# Patient Record
Sex: Female | Born: 1990 | Race: Black or African American | Hispanic: No | Marital: Single | Smoking: Current every day smoker
Health system: Southern US, Community
[De-identification: ages and names within clinical notes are randomized; demographics above are authoritative.]

## PROBLEM LIST (undated history)

## (undated) DIAGNOSIS — O009 Unspecified ectopic pregnancy without intrauterine pregnancy: Secondary | ICD-10-CM

## (undated) DIAGNOSIS — A539 Syphilis, unspecified: Secondary | ICD-10-CM

## (undated) DIAGNOSIS — D649 Anemia, unspecified: Secondary | ICD-10-CM

## (undated) HISTORY — DX: Syphilis, unspecified: A53.9

## (undated) HISTORY — PX: SALPINGECTOMY: SHX328

## (undated) HISTORY — PX: TUBAL LIGATION: SHX77

## (undated) HISTORY — DX: Anemia, unspecified: D64.9

---

## 2011-06-05 DIAGNOSIS — A749 Chlamydial infection, unspecified: Secondary | ICD-10-CM

## 2011-06-05 DIAGNOSIS — A599 Trichomoniasis, unspecified: Secondary | ICD-10-CM

## 2011-06-05 HISTORY — DX: Trichomoniasis, unspecified: A59.9

## 2011-06-05 HISTORY — DX: Chlamydial infection, unspecified: A74.9

## 2021-02-07 ENCOUNTER — Emergency Department (HOSPITAL_COMMUNITY)
Admission: EM | Admit: 2021-02-07 | Discharge: 2021-02-07 | Disposition: A | Discharge status: Home / Self Care | Payer: Medicaid Other | Attending: Emergency Medicine | Admitting: Emergency Medicine

## 2021-02-07 ENCOUNTER — Other Ambulatory Visit: Payer: Self-pay

## 2021-02-07 ENCOUNTER — Encounter (HOSPITAL_COMMUNITY): Payer: Self-pay

## 2021-02-07 ENCOUNTER — Emergency Department (HOSPITAL_COMMUNITY): Payer: Medicaid Other

## 2021-02-07 DIAGNOSIS — R109 Unspecified abdominal pain: Secondary | ICD-10-CM

## 2021-02-07 DIAGNOSIS — F17210 Nicotine dependence, cigarettes, uncomplicated: Secondary | ICD-10-CM | POA: Diagnosis not present

## 2021-02-07 DIAGNOSIS — Z3A01 Less than 8 weeks gestation of pregnancy: Secondary | ICD-10-CM | POA: Insufficient documentation

## 2021-02-07 DIAGNOSIS — R000 Tachycardia, unspecified: Secondary | ICD-10-CM | POA: Insufficient documentation

## 2021-02-07 DIAGNOSIS — O26891 Other specified pregnancy related conditions, first trimester: Secondary | ICD-10-CM | POA: Diagnosis present

## 2021-02-07 HISTORY — DX: Unspecified ectopic pregnancy without intrauterine pregnancy: O00.90

## 2021-02-07 LAB — COMPREHENSIVE METABOLIC PANEL
ALT: 24 U/L (ref 0–44)
AST: 21 U/L (ref 15–41)
Albumin: 4.8 g/dL (ref 3.5–5.0)
Alkaline Phosphatase: 61 U/L (ref 38–126)
Anion gap: 8 (ref 5–15)
BUN: 7 mg/dL (ref 6–20)
CO2: 22 mmol/L (ref 22–32)
Calcium: 9.4 mg/dL (ref 8.9–10.3)
Chloride: 109 mmol/L (ref 98–111)
Creatinine, Ser: 0.6 mg/dL (ref 0.44–1.00)
GFR, Estimated: >60 mL/min (ref >60)
Glucose, Bld: 104 mg/dL — ABNORMAL HIGH (ref 70–99)
Potassium: 3.3 mmol/L — ABNORMAL LOW (ref 3.5–5.1)
Sodium: 139 mmol/L (ref 135–145)
Total Bilirubin: 0.3 mg/dL (ref 0.3–1.2)
Total Protein: 8.4 g/dL — ABNORMAL HIGH (ref 6.5–8.1)

## 2021-02-07 LAB — CBC
HCT: 39.2 % (ref 36.0–46.0)
Hemoglobin: 13.3 g/dL (ref 12.0–15.0)
MCH: 31.3 pg (ref 26.0–34.0)
MCHC: 33.9 g/dL (ref 30.0–36.0)
MCV: 92.2 fL (ref 80.0–100.0)
Platelets: 204 10*3/uL (ref 150–400)
RBC: 4.25 MIL/uL (ref 3.87–5.11)
RDW: 13.2 % (ref 11.5–15.5)
WBC: 12.7 10*3/uL — ABNORMAL HIGH (ref 4.0–10.5)
nRBC: 0 % (ref 0.0–0.2)

## 2021-02-07 LAB — URINALYSIS, ROUTINE W REFLEX MICROSCOPIC
Bilirubin Urine: NEGATIVE
Glucose, UA: NEGATIVE mg/dL
Hgb urine dipstick: NEGATIVE
Ketones, ur: NEGATIVE mg/dL
Leukocytes,Ua: NEGATIVE
Nitrite: NEGATIVE
Protein, ur: NEGATIVE mg/dL
Specific Gravity, Urine: 1.01 (ref 1.005–1.030)
pH: 6.5 (ref 5.0–8.0)

## 2021-02-07 LAB — HCG, QUANTITATIVE, PREGNANCY: hCG, Beta Chain, Quant, S: 50485 m[IU]/mL — ABNORMAL HIGH (ref <5)

## 2021-02-07 LAB — I-STAT BETA HCG BLOOD, ED (MC, WL, AP ONLY): I-stat hCG, quantitative: >2000 m[IU]/mL — ABNORMAL HIGH (ref <5)

## 2021-02-07 LAB — LIPASE, BLOOD: Lipase: 28 U/L (ref 11–51)

## 2021-02-07 NOTE — Discharge Instructions (Addendum)
As discussed, your evaluation today has been largely reassuring.  But, it is important that you monitor your condition carefully, and do not hesitate to return to the ED if you develop new, or concerning changes in your condition.  Otherwise, please follow-up with your physician for appropriate ongoing care.  Below is the interpretation of today's ultrasound: IMPRESSION: 1. Single live intrauterine pregnancy identified as above. 2. Small amount of fluid in the endometrial cavity. Attention on follow up obstetric ultrasounds.

## 2021-02-07 NOTE — ED Triage Notes (Signed)
Patient c/o left mid abdominal pain x 2 days. Patient states she had a positive home pregnancy test . Patient reports a history of ectopic pregnancy.

## 2021-02-07 NOTE — ED Provider Notes (Signed)
[facility]-EMERGENCY DEPT Provider Note   CSN: [id number] Arrival date & time: 02/07/21  0934     History Chief Complaint  Patient presents with   Abdominal Pain    [facility] is a 30 y.o. female.  HPI Patient with a history of ectopic pregnancy, tubal ligation presents with left-sided abdominal pain and home positive pregnancy test. She has no other medical problems.  Last menstrual period was about 6 weeks ago.  5 days ago she developed mild left-sided lower abdominal pain which has progressed and is severe.  No associated vomiting, urinary complaints, fever, but with worsening pain she spoke with her physician and was encouraged to come here for evaluation.    Past Medical History:  Diagnosis Date   Ectopic pregnancy     There are no problems to display for this patient.   Past Surgical History:  Procedure Laterality Date   TUBAL LIGATION       OB History   No obstetric history on file.     History reviewed. No pertinent family history.  Social History   Tobacco Use   Smoking status: Every Day    Packs/day: 0.25    Types: Cigarettes   Smokeless tobacco: Never  Vaping Use   Vaping Use: Never used  Substance Use Topics   Alcohol use: Never    Home Medications Prior to Admission medications   Medication Sig Start Date End Date Taking? Authorizing Provider  Multiple Vitamin (MULTIVITAMIN WITH MINERALS) TABS tablet Take 1 tablet by mouth daily.   Yes [provider]    Allergies    Patient has no known allergies.  Review of Systems   Review of Systems  Constitutional:        Per HPI, otherwise negative  HENT:         Per HPI, otherwise negative  Respiratory:         Per HPI, otherwise negative  Cardiovascular:        Per HPI, otherwise negative  Gastrointestinal:  Positive for abdominal pain. Negative for vomiting.  Endocrine:       Negative aside from HPI  Genitourinary:        Neg aside from HPI    Musculoskeletal:        Per HPI, otherwise negative  Skin: Negative.   Neurological:  Negative for syncope.   Physical Exam Updated Vital Signs BP 120/70 (BP Location: Left Arm)   Pulse 88   Temp 98.2 F (36.8 C) (Oral)   Resp 16   Ht 4\' 9"  (1.448 m)   Wt 49.9 kg   LMP 12/23/2020 (Approximate)   SpO2 100%   BMI 23.80 kg/m   Physical Exam Vitals and nursing note reviewed.  Constitutional:      General: She is not in acute distress.    Appearance: She is well-developed.  HENT:     Head: Normocephalic and atraumatic.  Eyes:     Conjunctiva/sclera: Conjunctivae normal.  Cardiovascular:     Rate and Rhythm: Regular rhythm. Tachycardia present.  Pulmonary:     Effort: Pulmonary effort is normal. No respiratory distress.     Breath sounds: Normal breath sounds. No stridor.  Abdominal:     General: There is no distension.     Tenderness: There is abdominal tenderness.  Skin:    General: Skin is warm and dry.  Neurological:     Mental Status: She is alert and oriented to person, place, and time.  Cranial Nerves: No cranial nerve deficit.    ED Results / Procedures / Treatments   Labs (all labs ordered are listed, but only abnormal results are displayed) Labs Reviewed  COMPREHENSIVE METABOLIC PANEL - Abnormal; Notable for the following components:      Result Value   Potassium 3.3 (*)    Glucose, Bld 104 (*)    Total Protein 8.4 (*)    All other components within normal limits  CBC - Abnormal; Notable for the following components:   WBC 12.7 (*)    All other components within normal limits  HCG, QUANTITATIVE, PREGNANCY - Abnormal; Notable for the following components:   hCG, Beta Chain, Quant, S 50,485 (*)    All other components within normal limits  I-STAT BETA HCG BLOOD, ED ([facility], [facility], [facility] ONLY) - Abnormal; Notable for the following components:   I-stat hCG, quantitative >2,000.0 (*)    All other components within normal limits  LIPASE, BLOOD  URINALYSIS,  ROUTINE W REFLEX MICROSCOPIC    EKG None  Radiology US OB Comp < 14 Wks  Result Date: 02/07/2021 CLINICAL DATA:  Rule out ectopic EXAM: OBSTETRIC <14 WK ULTRASOUND TECHNIQUE: Transabdominal ultrasound was performed for evaluation of the gestation as well as the maternal uterus and adnexal regions. COMPARISON:  None. FINDINGS: Intrauterine gestational sac: Single Yolk sac:  Not Visualized. Embryo:  Visualized. Cardiac Activity: Visualized. Heart Rate: 275 bpm MSD:  17.6 mm   6 w   5 d CRL:   10.0 mm   7 w 1 d Subchorionic hemorrhage:  None visualized. Maternal uterus/adnexae: The ovaries are normal with a probable corpus luteum on the right. There is a small amount of fluid in the endometrial cavity. IMPRESSION: 1. Single live intrauterine pregnancy identified as above. 2. Small amount of fluid in the endometrial cavity. Attention on follow up obstetric ultrasounds. Electronically Signed   By: Lesia Hausen M.D.   On: 02/07/2021 12:10   US OB Transvaginal  Result Date: 02/07/2021 CLINICAL DATA:  Rule out ectopic EXAM: OBSTETRIC <14 WK ULTRASOUND TECHNIQUE: Transabdominal ultrasound was performed for evaluation of the gestation as well as the maternal uterus and adnexal regions. COMPARISON:  None. FINDINGS: Intrauterine gestational sac: Single Yolk sac:  Not Visualized. Embryo:  Visualized. Cardiac Activity: Visualized. Heart Rate: 275 bpm MSD:  17.6 mm   6 w   5 d CRL:   10.0 mm   7 w 1 d Subchorionic hemorrhage:  None visualized. Maternal uterus/adnexae: The ovaries are normal with a probable corpus luteum on the right. There is a small amount of fluid in the endometrial cavity. IMPRESSION: 1. Single live intrauterine pregnancy identified as above. 2. Small amount of fluid in the endometrial cavity. Attention on follow up obstetric ultrasounds. Electronically Signed   By: Lesia Hausen M.D.   On: 02/07/2021 12:10    Procedures Procedures   Medications Ordered in ED Medications - No data to  display  ED Course  I have reviewed the triage vital signs and the nursing notes.  Pertinent labs & imaging results that were available during my care of the patient were reviewed by me and considered in my medical decision making (see chart for details).   12:47 PM Patient in no distress, sitting upright.  Labs reviewed, discussed, ultrasound also reviewed, discussed.  Ultrasound consistent with intrauterine pregnancy, no evidence for ectopic.  Labs consistent with estimated gestational age about 6/7 weeks.  No evidence for distress, patient appropriate for discharge with outpatient follow-up with  our obstetrics clinic. MDM Rules/Calculators/A&P MDM Number of Diagnoses or Management Options Abdominal pain during pregnancy in first trimester: new, needed workup   Amount and/or Complexity of Data Reviewed Clinical lab tests: ordered and reviewed Tests in the radiology section of CPT: ordered and reviewed Tests in the medicine section of CPT: reviewed and ordered Decide to obtain previous medical records or to obtain history from someone other than the patient: yes Review and summarize past medical records: yes Independent visualization of images, tracings, or specimens: yes  Risk of Complications, Morbidity, and/or Mortality Presenting problems: high Diagnostic procedures: high Management options: high  Critical Care Total time providing critical care: < 30 minutes  Patient Progress Patient progress: stable   Final Clinical Impression(s) / ED Diagnoses Final diagnoses:  Abdominal pain during pregnancy in first trimester     Gerhard Munch, MD 02/07/21 1248

## 2021-02-22 ENCOUNTER — Telehealth (INDEPENDENT_AMBULATORY_CARE_PROVIDER_SITE_OTHER): Payer: Medicaid Other

## 2021-02-22 DIAGNOSIS — R112 Nausea with vomiting, unspecified: Secondary | ICD-10-CM

## 2021-02-22 DIAGNOSIS — O09219 Supervision of pregnancy with history of pre-term labor, unspecified trimester: Secondary | ICD-10-CM

## 2021-02-22 DIAGNOSIS — Z5941 Food insecurity: Secondary | ICD-10-CM

## 2021-02-22 DIAGNOSIS — O0990 Supervision of high risk pregnancy, unspecified, unspecified trimester: Secondary | ICD-10-CM | POA: Insufficient documentation

## 2021-02-22 DIAGNOSIS — Z3A00 Weeks of gestation of pregnancy not specified: Secondary | ICD-10-CM

## 2021-02-22 MED ORDER — PRENATAL PLUS 27-1 MG PO TABS: 1.0000 | ORAL_TABLET | Freq: Every day | ORAL | 11 refills | Status: DC

## 2021-02-22 MED ORDER — VITAMIN B-6 25 MG PO TABS: 25.0000 mg | ORAL_TABLET | Freq: Every day | ORAL | 3 refills | Status: DC

## 2021-02-22 MED ORDER — DIPHENHYDRAMINE HCL 50 MG PO TABS
50.0000 mg | ORAL_TABLET | Freq: Every evening | ORAL | 0 refills | Status: DC | PRN
Start: 2021-02-22 — End: 2021-03-07

## 2021-02-22 NOTE — Progress Notes (Signed)
New OB Intake  I connected with  [facility] on 02/22/21 at 11:15 AM EDT by MyChart Video Visit and verified that I am speaking with the correct person using two identifiers. Nurse is located at [facility] [place] and pt is located at home.  I discussed the limitations, risks, security and privacy concerns of performing an evaluation and management service by telephone and the availability of in person appointments. I also discussed with the patient that there may be a patient responsible charge related to this service. The patient expressed understanding and agreed to proceed.  I explained I am completing New OB Intake today. We discussed her EDD of 09/28/21 that is based on LMP of 12/22/20. Pt is G4/P1. I reviewed her allergies, medications, Medical/Surgical/OB history, and appropriate screenings. I informed her of [facility] services. Based on history, this is a/an  pregnancy complicated by preterm labor  .   Patient Active Problem List   Diagnosis Date Noted   Preterm delivery, delivered 02/22/2021   Supervision of high risk pregnancy, antepartum 02/22/2021    Concerns addressed today  Delivery Plans:  [facility] at [facility]   MyChart/Babyscripts MyChart access verified. I explained pt will have some visits in office and some virtually. Babyscripts instructions given and order placed. Patient verifies receipt of registration text/e-mail. Account successfully created and app downloaded.  Blood Pressure Cuff  Blood pressure cuff ordered for patient to pick-up from [facility]. Explained after first prenatal appt pt will check weekly and document in Babyscripts.  Weight scale: Patient    have weight scale. Weight scale ordered for patient to pick up form [facility].   Anatomy US Explained first scheduled [place] will be around 19 weeks. Anatomy US scheduled for 05/04/21 at 1:30p. Pt notified to arrive at 1:15p.  Labs Discussed Avelina Laine genetic screening with patient. Would like both  Panorama and Horizon drawn at new OB visit. Routine prenatal labs needed.  Covid Vaccine Patient has not covid vaccine.   Mother/ [facility] Candidate?    If yes, offer as possibility  Informed patient of [facility] website  and placed link in her AVS.   Social Determinants of Health Food Insecurity: Patient expresses food insecurity. Food Market information given to patient; explained patient may visit at the end of first OB appointment. WIC Referral: Patient is interested in referral to [facility].  Transportation: Patient denies transportation needs. Childcare: Discussed no children allowed at ultrasound appointments. Offered childcare services; patient declines childcare services at this time.  Send link to Pregnancy Navigators   Placed OB Box on problem list and updated  First visit review I reviewed new OB appt with pt. I explained she will have a pelvic exam, ob bloodwork with genetic screening, and PAP smear. Explained pt will be seen by Dr. Crissie Reese at first visit; encounter routed to appropriate provider. Explained that patient will be seen by pregnancy navigator following visit with provider. [place] [facility] information placed in AVS.   [facility], CMA 02/22/2021  12:01 PM

## 2021-02-22 NOTE — Addendum Note (Signed)
Addended by: [name] on: 02/22/2021 04:05 PM   Modules accepted: Orders

## 2021-03-06 ENCOUNTER — Telehealth: Payer: Self-pay | Admitting: *Deleted

## 2021-03-06 NOTE — Telephone Encounter (Signed)
Called pt in response to her Mychart message. She ahd been offered a nurse visit appt if she wanted to hear her baby's heartbeat and responded that she would like that. I advised pt that we would be happy to try however she is [redacted]w[redacted]d at this time and 10w is the earliest time we try to check the heartbeat. Meaning that it is possible that we wouldn't be able to hear it with the doppler device. We are willing to try if she would like or she may just keep her scheduled appt on 10/12 and we will check @ that time. Pt stated that she will wait until scheduled appt on 10/13 because she is not bleeding and mostly just has fatigue. Pt was advised that fatigue is normal. She may reach out with additional questions or concerns as needed.  Pt voiced understanding.

## 2021-03-07 ENCOUNTER — Other Ambulatory Visit: Payer: Self-pay

## 2021-03-07 ENCOUNTER — Inpatient Hospital Stay (HOSPITAL_COMMUNITY)
Admission: AD | Admit: 2021-03-07 | Discharge: 2021-03-07 | Disposition: A | Discharge status: Home / Self Care | Payer: Medicaid Other | Attending: Obstetrics and Gynecology | Admitting: Obstetrics and Gynecology

## 2021-03-07 ENCOUNTER — Encounter (HOSPITAL_COMMUNITY): Payer: Self-pay | Admitting: Obstetrics and Gynecology

## 2021-03-07 ENCOUNTER — Ambulatory Visit: Payer: Medicaid Other

## 2021-03-07 DIAGNOSIS — O0990 Supervision of high risk pregnancy, unspecified, unspecified trimester: Secondary | ICD-10-CM

## 2021-03-07 DIAGNOSIS — O09291 Supervision of pregnancy with other poor reproductive or obstetric history, first trimester: Secondary | ICD-10-CM | POA: Insufficient documentation

## 2021-03-07 DIAGNOSIS — O209 Hemorrhage in early pregnancy, unspecified: Secondary | ICD-10-CM | POA: Diagnosis present

## 2021-03-07 DIAGNOSIS — O200 Threatened abortion: Secondary | ICD-10-CM | POA: Diagnosis not present

## 2021-03-07 DIAGNOSIS — O99331 Smoking (tobacco) complicating pregnancy, first trimester: Secondary | ICD-10-CM | POA: Diagnosis not present

## 2021-03-07 DIAGNOSIS — F17210 Nicotine dependence, cigarettes, uncomplicated: Secondary | ICD-10-CM | POA: Insufficient documentation

## 2021-03-07 DIAGNOSIS — Z3A10 10 weeks gestation of pregnancy: Secondary | ICD-10-CM | POA: Diagnosis not present

## 2021-03-07 DIAGNOSIS — Z6790 Unspecified blood type, Rh positive: Secondary | ICD-10-CM

## 2021-03-07 LAB — CBC
HCT: 34.7 % — ABNORMAL LOW (ref 36.0–46.0)
Hemoglobin: 11.8 g/dL — ABNORMAL LOW (ref 12.0–15.0)
MCH: 30.9 pg (ref 26.0–34.0)
MCHC: 34 g/dL (ref 30.0–36.0)
MCV: 90.8 fL (ref 80.0–100.0)
Platelets: 186 10*3/uL (ref 150–400)
RBC: 3.82 MIL/uL — ABNORMAL LOW (ref 3.87–5.11)
RDW: 13.2 % (ref 11.5–15.5)
WBC: 9.4 10*3/uL (ref 4.0–10.5)
nRBC: 0 % (ref 0.0–0.2)

## 2021-03-07 LAB — WET PREP, GENITAL
Clue Cells Wet Prep HPF POC: NONE SEEN
Sperm: NONE SEEN
Trich, Wet Prep: NONE SEEN
Yeast Wet Prep HPF POC: NONE SEEN

## 2021-03-07 LAB — ABO/RH: ABO/RH(D): O POS

## 2021-03-07 NOTE — MAU Note (Addendum)
.  [facility]ri [facility] is a 30 y.o. at [redacted]w[redacted]d here in MAU reporting: vaginal spotting that occurred once yesterday. She states she woke up this morning and saw "brownish-pink" spotting but then later this morning she felt pressure as if she needed to have a bowel movement but was unable to have a bowel movement. She states the bleeding is now dark red and heavier. She denies any blood clots. She states her vaginal discharge prior to her bleeding was nonexistent. Denies pain.  BP: 126/66 P: 111 T: 97.9 F R: 20 O2: 100  Pain score:  Denies any pain.  FHT: 166 doppler Lab orders placed from triage: UA

## 2021-03-07 NOTE — MAU Provider Note (Signed)
History     CSN: [id number]  Arrival date and time: 03/07/21 0856   Event Date/Time   First Provider Initiated Contact with Patient 03/07/21 [phone]      Chief Complaint  Patient presents with   Abdominal Pain   Vaginal Bleeding   30 y.o. [id number] @10 .4 wks with IUP presenting with VB and abd pain. Had [place]/pink spotting that started last night. Today it became heavier and more dark red. Only saw it when she wiped with toilet paper. No recent IC. Reports cramping since this am. She used Tylenol around 7am.    OB History     Gravida  4   Para  2   Term  1   Preterm  1   AB  1   Living  2      SAB      IAB      Ectopic  1   Multiple      Live Births  2           Past Medical History:  Diagnosis Date   Anemia    Chlamydia 2013   Ectopic pregnancy    Trichomoniasis 2013    Past Surgical History:  Procedure Laterality Date   SALPINGECTOMY Left    In the setting of ruptured ectopic    Family History  Problem Relation Age of Onset   Hypertension Maternal Grandmother     Social History   Tobacco Use   Smoking status: Every Day    Packs/day: 0.25    Types: Cigarettes   Smokeless tobacco: Never  Vaping Use   Vaping Use: Never used  Substance Use Topics   Alcohol use: Never    Allergies: No Known Allergies  Medications Prior to Admission  Medication Sig Dispense Refill Last Dose   diphenhydramine-acetaminophen (TYLENOL PM) 25-500 MG TABS tablet Take 1 tablet by mouth at bedtime as needed.   03/07/2021 at 0700   prenatal vitamin w/FE, FA (PRENATAL 1 + 1) 27-1 MG TABS tablet Take 1 tablet by mouth daily at 12 noon. 30 tablet 11 Past Week   diphenhydrAMINE (BENADRYL) 50 MG tablet Take 1 tablet (50 mg total) by mouth at bedtime as needed for itching. 30 tablet 0    Multiple Vitamin (MULTIVITAMIN WITH MINERALS) TABS tablet Take 1 tablet by mouth daily. (Patient not taking: Reported on 02/22/2021)      vitamin B-6 (PYRIDOXINE) 25 MG tablet Take 1  tablet (25 mg total) by mouth daily. 30 tablet 3     Review of Systems  Gastrointestinal:  Positive for abdominal pain.  Genitourinary:  Positive for vaginal discharge.  Physical Exam   Blood pressure 126/69, pulse 91, temperature 97.9 F (36.6 C), temperature source Oral, resp. rate 20, height 5\' 1"  (1.549 m), weight 50.3 kg, last menstrual period 12/23/2020, SpO2 100 %.  Physical Exam Vitals and nursing note reviewed. Exam conducted with a chaperone present.  Constitutional:      General: She is not in acute distress (appears comfortable).    Appearance: Normal appearance.  HENT:     Head: Normocephalic and atraumatic.  Cardiovascular:     Rate and Rhythm: Normal rate.  Pulmonary:     Effort: Pulmonary effort is normal. No respiratory distress.  Abdominal:     General: There is no distension.     Palpations: Abdomen is soft. There is no mass.     Tenderness: There is no abdominal tenderness. There is no guarding or rebound.  Hernia: No hernia is present.  Genitourinary:    Comments: External: no lesions or erythema Vagina: rugated, pink, moist, small amt drk bloody discharge, 1 small clot Unable to visualize cervix d/t pt intolerant of exam   Musculoskeletal:        General: Normal range of motion.     Cervical back: Normal range of motion.  Skin:    General: Skin is warm and dry.  Neurological:     General: No focal deficit present.     Mental Status: She is alert and oriented to person, place, and time.  Psychiatric:        Mood and Affect: Mood is anxious.        Behavior: Behavior normal.   Limited bedside US: viable, active fetus, +cardiac activity, subj. nml AFV, no SCH seen  Results for orders placed or performed during the hospital encounter of 03/07/21 (from the past 24 hour(s))  Wet prep, genital     Status: Abnormal   Collection Time: 03/07/21  9:57 AM   Specimen: PATH Cytology Cervicovaginal Ancillary Only  Result Value Ref Range   Yeast Wet Prep  HPF POC NONE SEEN NONE SEEN   Trich, Wet Prep NONE SEEN NONE SEEN   Clue Cells Wet Prep HPF POC NONE SEEN NONE SEEN   WBC, Wet Prep HPF POC FEW (A) NONE SEEN   Sperm NONE SEEN   CBC     Status: Abnormal   Collection Time: 03/07/21 10:14 AM  Result Value Ref Range   WBC 9.4 4.0 - 10.5 K/uL   RBC 3.82 (L) 3.87 - 5.11 MIL/uL   Hemoglobin 11.8 (L) 12.0 - 15.0 g/dL   HCT 40.9 (L) 81.1 - 91.4 %   MCV 90.8 80.0 - 100.0 fL   MCH 30.9 26.0 - 34.0 pg   MCHC 34.0 30.0 - 36.0 g/dL   RDW 78.2 95.6 - 21.3 %   Platelets 186 150 - 400 K/uL   nRBC 0.0 0.0 - 0.2 %  ABO/Rh     Status: None   Collection Time: 03/07/21 10:18 AM  Result Value Ref Range   ABO/RH(D) O POS    No rh immune globuloin      NOT A RH IMMUNE GLOBULIN CANDIDATE, PT RH POSITIVE Performed at [facility], [address], [place], [place] [address]     MAU Course  Procedures  MDM Labs ordered and reviewed. Viable IUP confirmed. Unclear etiology, discussed return precautions. Stable for discharge home.   Assessment and Plan   1. Supervision of high risk pregnancy, antepartum   2. [redacted] weeks gestation of pregnancy   3. Threatened abortion   4. Blood type, Rh positive    Discharge home Follow up at [facility] as scheduled next week Pelvic rest SAB precautions  Allergies as of 03/07/2021   No Known Allergies      Medication List     STOP taking these medications    diphenhydrAMINE 50 MG tablet Commonly known as: BENADRYL   multivitamin with minerals Tabs tablet       TAKE these medications    diphenhydramine-acetaminophen 25-500 MG Tabs tablet Commonly known as: TYLENOL PM Take 1 tablet by mouth at bedtime as needed.   prenatal vitamin w/FE, FA 27-1 MG Tabs tablet Take 1 tablet by mouth daily at 12 noon.   vitamin B-6 25 MG tablet Commonly known as: pyridOXINE Take 1 tablet (25 mg total) by mouth daily.  Donette Larry, CNM 03/07/2021, 11:18 AM

## 2021-03-07 NOTE — MAU Provider Note (Signed)
History     CSN: [id number]  Arrival date and time: 03/07/21 0856   Event Date/Time   First Provider Initiated Contact with Patient 03/07/21 [phone]      Chief Complaint  Patient presents with   Abdominal Pain   Vaginal Bleeding   30 y/o F [id number] [redacted]w[redacted]d w/ EDD of 09/28/21 based on LMP of 12/22/20. She is presenting to MAU with [place]/pink discharge that began last night and continued into the morning with minimal cramping. This morning when attempting to have a bowel movement, she noticed dark red blood only on tissue paper when wiping. Denies any previous complications with pregnancy nor is she experiencing fever or other symptomatology. Denies any sexual intercourse in the last week. She has taken tylenol for the cramping, and regularly takes a prenatal vitamin. Pregnancy was confirmed by ultrasound on 02/07/21. Pertinent PMHx includes left salpingectomy for ectopic rupture and h/o chlamydia and trich.    OB History     Gravida  4   Para  2   Term  1   Preterm  1   AB  1   Living  2      SAB      IAB      Ectopic  1   Multiple      Live Births  2           Past Medical History:  Diagnosis Date   Anemia    Chlamydia 2013   Ectopic pregnancy    Trichomoniasis 2013    Past Surgical History:  Procedure Laterality Date   TUBAL LIGATION      Family History  Problem Relation Age of Onset   Hypertension Maternal Grandmother     Social History   Tobacco Use   Smoking status: Every Day    Packs/day: 0.25    Types: Cigarettes   Smokeless tobacco: Never  Vaping Use   Vaping Use: Never used  Substance Use Topics   Alcohol use: Never    Allergies: No Known Allergies  Medications Prior to Admission  Medication Sig Dispense Refill Last Dose   diphenhydramine-acetaminophen (TYLENOL PM) 25-500 MG TABS tablet Take 1 tablet by mouth at bedtime as needed.   03/07/2021 at 0700   prenatal vitamin w/FE, FA (PRENATAL 1 + 1) 27-1 MG TABS tablet Take 1 tablet by  mouth daily at 12 noon. 30 tablet 11 Past Week   diphenhydrAMINE (BENADRYL) 50 MG tablet Take 1 tablet (50 mg total) by mouth at bedtime as needed for itching. 30 tablet 0    Multiple Vitamin (MULTIVITAMIN WITH MINERALS) TABS tablet Take 1 tablet by mouth daily. (Patient not taking: Reported on 02/22/2021)      vitamin B-6 (PYRIDOXINE) 25 MG tablet Take 1 tablet (25 mg total) by mouth daily. 30 tablet 3     Review of Systems  Constitutional:  Negative for fever.  Gastrointestinal:  Negative for blood in stool.  Genitourinary:  Positive for vaginal bleeding. Negative for hematuria.  Physical Exam   Blood pressure 126/69, pulse 91, temperature 97.9 F (36.6 C), temperature source Oral, resp. rate 20, height 5\' 1"  (1.549 m), weight 50.3 kg, last menstrual period 12/23/2020, SpO2 100 %.  Physical Exam Vitals and nursing note reviewed. Exam conducted with a chaperone present.  Constitutional:      Appearance: She is not ill-appearing or toxic-appearing.  Pulmonary:     Effort: Pulmonary effort is normal.  Abdominal:     Tenderness: There is no abdominal tenderness. There  is no guarding.  Genitourinary:    Vagina: No signs of injury and foreign body. Tenderness present. No vaginal discharge.     Uterus: Not tender.      Comments: Unable to visualize cervical os. 62mm clot present in vaginal canal along with minimal bright red blood, no active bleeding or pooling visualized.  Neurological:     Mental Status: She is alert.    MAU Course  Procedures  Wet prep genital and GC probe, transabdominal ultrasound UA w/ reflex, CBC, ABO/Rh  Assessment and Plan  Threatened abortion in high risk pregnancy Stable, non-ill-appearing 30 y/o F [id number] @[redacted]w[redacted]d . Active pregnancy confirmed via transabdominal ultrasound, no subchorionic hemorrhage identified. Differential includes threatened abortion in the setting of 1st trimester pregnancy, infection, unidentified subchorionic hemorrhage. Given strict  return precautions in the context of bleeding and pain, advised pelvic rest. Awaiting UA w/ reflex and GC probe results upon discharge. Already has scheduled appointment on 03/15/21 for initial OB visit.   05/15/21 03/07/2021, 10:12 AM

## 2021-03-08 LAB — GC/CHLAMYDIA PROBE AMP (~~LOC~~) NOT AT ARMC
Chlamydia: NEGATIVE
Comment: NEGATIVE
Comment: NORMAL
Neisseria Gonorrhea: NEGATIVE

## 2021-03-15 ENCOUNTER — Other Ambulatory Visit: Payer: Self-pay

## 2021-03-15 ENCOUNTER — Other Ambulatory Visit (HOSPITAL_COMMUNITY)
Admission: RE | Admit: 2021-03-15 | Discharge: 2021-03-15 | Disposition: A | Discharge status: Home / Self Care | Payer: Medicaid Other | Source: Ambulatory Visit | Attending: Family Medicine | Admitting: Family Medicine

## 2021-03-15 ENCOUNTER — Ambulatory Visit (INDEPENDENT_AMBULATORY_CARE_PROVIDER_SITE_OTHER): Payer: Medicaid Other | Admitting: Family Medicine

## 2021-03-15 ENCOUNTER — Encounter: Payer: Self-pay | Admitting: Family Medicine

## 2021-03-15 VITALS — BP 109/68 | HR 101 | Wt 111.3 lb

## 2021-03-15 DIAGNOSIS — Z124 Encounter for screening for malignant neoplasm of cervix: Secondary | ICD-10-CM

## 2021-03-15 DIAGNOSIS — O0990 Supervision of high risk pregnancy, unspecified, unspecified trimester: Secondary | ICD-10-CM | POA: Insufficient documentation

## 2021-03-15 DIAGNOSIS — Z5941 Food insecurity: Secondary | ICD-10-CM

## 2021-03-15 DIAGNOSIS — Z98891 History of uterine scar from previous surgery: Secondary | ICD-10-CM | POA: Insufficient documentation

## 2021-03-15 NOTE — Progress Notes (Signed)
Subjective:   Sherri Flores is a 30 y.o. [id number] at [redacted]w[redacted]d by early ultrasound being seen today for her first obstetrical visit.  Her obstetrical history is significant for  preterm delivery at 35 weeks and history of CS x1 followed by VBAC x1 . Patient  unsure if she  intends to breast feed. Pregnancy history fully reviewed.  Patient reports no complaints.  HISTORY: OB History  Gravida Para Term Preterm AB Living  4 2 1 1 1 2   SAB IAB Ectopic Multiple Live Births  0 0 1 0 2    # Outcome Date GA Lbr Len/2nd Weight Sex Delivery Anes PTL Lv  4 Current           3 Ectopic 05/2017          2 Term 04/03/14 [redacted]w[redacted]d   F Vag-Spont   LIV  1 Preterm 03/22/09 [redacted]w[redacted]d   F CS-Unspec  Y LIV     Last pap smear: No results found for: DIAGPAP, HPV, HPVHIGH Due  Past Medical History:  Diagnosis Date   Anemia    Chlamydia 2013   Ectopic pregnancy    Trichomoniasis 2013   Past Surgical History:  Procedure Laterality Date   CESAREAN SECTION     SALPINGECTOMY Left    In the setting of ruptured ectopic   Family History  Problem Relation Age of Onset   Hypertension Maternal Grandmother    Social History   Tobacco Use   Smoking status: Every Day    Packs/day: 0.25    Types: Cigarettes   Smokeless tobacco: Never  Vaping Use   Vaping Use: Never used  Substance Use Topics   Alcohol use: Never   No Known Allergies Current Outpatient Medications on File Prior to Visit  Medication Sig Dispense Refill   diphenhydramine-acetaminophen (TYLENOL PM) 25-500 MG TABS tablet Take 1 tablet by mouth at bedtime as needed.     prenatal vitamin w/FE, FA (PRENATAL 1 + 1) 27-1 MG TABS tablet Take 1 tablet by mouth daily at 12 noon. 30 tablet 11   No current facility-administered medications on file prior to visit.     Exam   Vitals:   03/15/21 1036  BP: 109/68  Pulse: (!) 101  Weight: 111 lb 4.8 oz (50.5 kg)   Fetal Heart Rate (bpm): 159  Uterus:     Pelvic Exam: Perineum: no  hemorrhoids, normal perineum   Vulva: normal external genitalia, no lesions   Vagina:  normal mucosa mild amount of white discharge but denies any symptoms   Cervix: no lesions and normal, pap smear done.   System: General: well-developed, well-nourished female in no acute distress   Skin: normal coloration and turgor, no rashes   Neurologic: oriented, normal, negative, normal mood   Extremities: normal strength, tone, and muscle mass, ROM of all joints is normal   HEENT PERRLA, extraocular movement intact and sclera clear, anicteric   Neck supple and no masses   Respiratory:  no respiratory distress      Assessment:   Pregnancy: 05/15/21 Patient Active Problem List   Diagnosis Date Noted   History of VBAC 03/15/2021   Preterm delivery, delivered 02/22/2021   Supervision of high risk pregnancy, antepartum 02/22/2021     Plan:  1. Supervision of high risk pregnancy, antepartum Initial labs drawn. Pap collected Continue prenatal vitamins. Genetic Screening discussed, NIPS: ordered. Ultrasound discussed; fetal anatomic survey: ordered. Problem list reviewed and updated. The nature of [facility] with multiple MDs and other Advanced Practice Providers was explained to patient; also emphasized that residents, students are part of our team. - Cytology - PAP( [place])  2. Food insecurity Food market - AMBULATORY REFERRAL TO [facility]  3. Preterm delivery, delivered Declines Makena  4. History of VBAC CS with first delivery after PROM and fetal intolerance to labor at 35 weeks Subsequent uncomplicated VBAC Both in [place] Desires TOLAC   Routine obstetric precautions reviewed. Return in 4 weeks (on 04/12/2021) for [facility], ob visit.

## 2021-03-15 NOTE — Patient Instructions (Signed)
Second Trimester of Pregnancy °The second trimester of pregnancy is from week 13 through week 27. This is months 4 through 6 of pregnancy. The second trimester is often a time when you feel your best. Your body has adjusted to being pregnant, and you begin to feel better physically. °During the second trimester: °Morning sickness has lessened or stopped completely. °You may have more energy. °You may have an increase in appetite. °The second trimester is also a time when the unborn baby (fetus) is growing rapidly. At the end of the sixth month, the fetus may be up to 12 inches long and weigh about 1½ pounds. You will likely begin to feel the baby move (quickening) between 16 and 20 weeks of pregnancy. °Body changes during your second trimester °Your body continues to go through many changes during your second trimester. The changes vary and generally return to normal after the baby is born. °Physical changes °Your weight will continue to increase. You will notice your lower abdomen bulging out. °You may begin to get stretch marks on your hips, abdomen, and breasts. °Your breasts will continue to grow and to become tender. °Dark spots or blotches (chloasma or mask of pregnancy) may develop on your face. °A dark line from your belly button to the pubic area (linea nigra) may appear. °You may have changes in your hair. These can include thickening of your hair, rapid growth, and changes in texture. Some people also have hair loss during or after pregnancy, or hair that feels dry or thin. °Health changes °You may develop headaches. °You may have heartburn. °You may develop constipation. °You may develop hemorrhoids or swollen, bulging veins (varicose veins). °Your gums may bleed and may be sensitive to brushing and flossing. °You may urinate more often because the fetus is pressing on your bladder. °You may have back pain. This is caused by: °Weight gain. °Pregnancy hormones that are relaxing the joints in your  pelvis. °A shift in weight and the muscles that support your balance. °Follow these instructions at home: °Medicines °Follow your health care provider's instructions regarding medicine use. Specific medicines may be either safe or unsafe to take during pregnancy. Do not take any medicines unless approved by your health care provider. °Take a prenatal vitamin that contains at least 600 micrograms (mcg) of folic acid. °Eating and drinking °Eat a healthy diet that includes fresh fruits and vegetables, whole grains, good sources of protein such as meat, eggs, or tofu, and low-fat dairy products. °Avoid raw meat and unpasteurized juice, milk, and cheese. These carry germs that can harm you and your baby. °You may need to take these actions to prevent or treat constipation: °Drink enough fluid to keep your urine pale yellow. °Eat foods that are high in fiber, such as beans, whole grains, and fresh fruits and vegetables. °Limit foods that are high in fat and processed sugars, such as fried or sweet foods. °Activity °Exercise only as directed by your health care provider. Most people can continue their usual exercise routine during pregnancy. Try to exercise for 30 minutes at least 5 days a week. Stop exercising if you develop contractions in your uterus. °Stop exercising if you develop pain or cramping in the lower abdomen or lower back. °Avoid exercising if it is very hot or humid or if you are at a high altitude. °Avoid heavy lifting. °If you choose to, you may have sex unless your health care provider tells you not to. °Relieving pain and discomfort °Wear a supportive bra   to prevent discomfort from breast tenderness. °Take warm sitz baths to soothe any pain or discomfort caused by hemorrhoids. Use hemorrhoid cream if your health care provider approves. °Rest with your legs raised (elevated) if you have leg cramps or low back pain. °If you develop varicose veins: °Wear support hose as told by your health care  provider. °Elevate your feet for 15 minutes, 3-4 times a day. °Limit salt in your diet. °Safety °Wear your seat belt at all times when driving or riding in a car. °Talk with your health care provider if someone is verbally or physically abusive to you. °Lifestyle °Do not use hot tubs, steam rooms, or saunas. °Do not douche. Do not use tampons or scented sanitary pads. °Avoid cat litter boxes and soil used by cats. These carry germs that can cause birth defects in the baby and possibly loss of the fetus by miscarriage or stillbirth. °Do not use herbal remedies, alcohol, illegal drugs, or medicines that are not approved by your health care provider. Chemicals in these products can harm your baby. °Do not use any products that contain nicotine or tobacco, such as cigarettes, e-cigarettes, and chewing tobacco. If you need help quitting, ask your health care provider. °General instructions °During a routine prenatal visit, your health care provider will do a physical exam and other tests. He or she will also discuss your overall health. Keep all follow-up visits. This is important. °Ask your health care provider for a referral to a local prenatal education class. °Ask for help if you have counseling or nutritional needs during pregnancy. Your health care provider can offer advice or refer you to specialists for help with various needs. °Where to find more information °American Pregnancy Association: americanpregnancy.org °American College of Obstetricians and Gynecologists: acog.org/en/Womens%20Health/Pregnancy °Office on Women's Health: womenshealth.gov/pregnancy °Contact a health care provider if you have: °A headache that does not go away when you take medicine. °Vision changes or you see spots in front of your eyes. °Mild pelvic cramps, pelvic pressure, or nagging pain in the abdominal area. °Persistent nausea, vomiting, or diarrhea. °A bad-smelling vaginal discharge or foul-smelling urine. °Pain when you  urinate. °Sudden or extreme swelling of your face, hands, ankles, feet, or legs. °A fever. °Get help right away if you: °Have fluid leaking from your vagina. °Have spotting or bleeding from your vagina. °Have severe abdominal cramping or pain. °Have difficulty breathing. °Have chest pain. °Have fainting spells. °Have not felt your baby move for the time period told by your health care provider. °Have new or increased pain, swelling, or redness in an arm or leg. °Summary °The second trimester of pregnancy is from week 13 through week 27 (months 4 through 6). °Do not use herbal remedies, alcohol, illegal drugs, or medicines that are not approved by your health care provider. Chemicals in these products can harm your baby. °Exercise only as directed by your health care provider. Most people can continue their usual exercise routine during pregnancy. °Keep all follow-up visits. This is important. °This information is not intended to replace advice given to you by your health care provider. Make sure you discuss any questions you have with your health care provider. °Document Revised: 10/28/2019 Document Reviewed: 09/03/2019 °Elsevier Patient Education © 2022 Elsevier Inc. ° °Contraception Choices °Contraception, also called birth control, refers to methods or devices that prevent pregnancy. °Hormonal methods °Contraceptive implant °A contraceptive implant is a thin, plastic tube that contains a hormone that prevents pregnancy. It is different from an intrauterine device (IUD). It   is inserted into the upper part of the arm by a health care provider. Implants can be effective for up to 3 years. °Progestin-only injections °Progestin-only injections are injections of progestin, a synthetic form of the hormone progesterone. They are given every 3 months by a health care provider. °Birth control pills °Birth control pills are pills that contain hormones that prevent pregnancy. They must be taken once a day, preferably at the  same time each day. A prescription is needed to use this method of contraception. °Birth control patch °The birth control patch contains hormones that prevent pregnancy. It is placed on the skin and must be changed once a week for three weeks and removed on the fourth week. A prescription is needed to use this method of contraception. °Vaginal ring °A vaginal ring contains hormones that prevent pregnancy. It is placed in the vagina for three weeks and removed on the fourth week. After that, the process is repeated with a new ring. A prescription is needed to use this method of contraception. °Emergency contraceptive °Emergency contraceptives prevent pregnancy after unprotected sex. They come in pill form and can be taken up to 5 days after sex. They work best the sooner they are taken after having sex. Most emergency contraceptives are available without a prescription. This method should not be used as your only form of birth control. °Barrier methods °Female condom °A female condom is a thin sheath that is worn over the penis during sex. Condoms keep sperm from going inside a woman's body. They can be used with a sperm-killing substance (spermicide) to increase their effectiveness. They should be thrown away after one use. °Female condom °A female condom is a soft, loose-fitting sheath that is put into the vagina before sex. The condom keeps sperm from going inside a woman's body. They should be thrown away after one use. °Diaphragm °A diaphragm is a soft, dome-shaped barrier. It is inserted into the vagina before sex, along with a spermicide. The diaphragm blocks sperm from entering the uterus, and the spermicide kills sperm. A diaphragm should be left in the vagina for 6-8 hours after sex and removed within 24 hours. °A diaphragm is prescribed and fitted by a health care provider. A diaphragm should be replaced every 1-2 years, after giving birth, after gaining more than 15 lb (6.8 kg), and after pelvic  surgery. °Cervical cap °A cervical cap is a round, soft latex or plastic cup that fits over the cervix. It is inserted into the vagina before sex, along with spermicide. It blocks sperm from entering the uterus. The cap should be left in place for 6-8 hours after sex and removed within 48 hours. A cervical cap must be prescribed and fitted by a health care provider. It should be replaced every 2 years. °Sponge °A sponge is a soft, circular piece of polyurethane foam with spermicide in it. The sponge helps block sperm from entering the uterus, and the spermicide kills sperm. To use it, you make it wet and then insert it into the vagina. It should be inserted before sex, left in for at least 6 hours after sex, and removed and thrown away within 30 hours. °Spermicides °Spermicides are chemicals that kill or block sperm from entering the cervix and uterus. They can come as a cream, jelly, suppository, foam, or tablet. A spermicide should be inserted into the vagina with an applicator at least 10-15 minutes before sex to allow time for it to work. The process must be repeated every time   you have sex. Spermicides do not require a prescription. °Intrauterine contraception °Intrauterine device (IUD) °An IUD is a T-shaped device that is put in a woman's uterus. There are two types: °Hormone IUD.This type contains progestin, a synthetic form of the hormone progesterone. This type can stay in place for 3-5 years. °Copper IUD.This type is wrapped in copper wire. It can stay in place for 10 years. °Permanent methods of contraception °Female tubal ligation °In this method, a woman's fallopian tubes are sealed, tied, or blocked during surgery to prevent eggs from traveling to the uterus. °Hysteroscopic sterilization °In this method, a small, flexible insert is placed into each fallopian tube. The inserts cause scar tissue to form in the fallopian tubes and block them, so sperm cannot reach an egg. The procedure takes about 3  months to be effective. Another form of birth control must be used during those 3 months. °Female sterilization °This is a procedure to tie off the tubes that carry sperm (vasectomy). After the procedure, the man can still ejaculate fluid (semen). Another form of birth control must be used for 3 months after the procedure. °Natural planning methods °Natural family planning °In this method, a couple does not have sex on days when the woman could become pregnant. °Calendar method °In this method, the woman keeps track of the length of each menstrual cycle, identifies the days when pregnancy can happen, and does not have sex on those days. °Ovulation method °In this method, a couple avoids sex during ovulation. °Symptothermal method °This method involves not having sex during ovulation. The woman typically checks for ovulation by watching changes in her temperature and in the consistency of cervical mucus. °Post-ovulation method °In this method, a couple waits to have sex until after ovulation. °Where to find more information °Centers for Disease Control and Prevention: www.cdc.gov °Summary °Contraception, also called birth control, refers to methods or devices that prevent pregnancy. °Hormonal methods of contraception include implants, injections, pills, patches, vaginal rings, and emergency contraceptives. °Barrier methods of contraception can include female condoms, female condoms, diaphragms, cervical caps, sponges, and spermicides. °There are two types of IUDs (intrauterine devices). An IUD can be put in a woman's uterus to prevent pregnancy for 3-5 years. °Permanent sterilization can be done through a procedure for males and females. Natural family planning methods involve nothaving sex on days when the woman could become pregnant. °This information is not intended to replace advice given to you by your health care provider. Make sure you discuss any questions you have with your health care provider. °Document  Revised: 10/26/2019 Document Reviewed: 10/26/2019 °Elsevier Patient Education © 2022 Elsevier Inc. ° °

## 2021-03-16 ENCOUNTER — Encounter: Payer: Self-pay | Admitting: Family Medicine

## 2021-03-16 DIAGNOSIS — O98111 Syphilis complicating pregnancy, first trimester: Secondary | ICD-10-CM | POA: Insufficient documentation

## 2021-03-16 LAB — CBC/D/PLT+RPR+RH+ABO+RUBIGG...
Antibody Screen: NEGATIVE
Basophils Absolute: 0.1 10*3/uL (ref 0.0–0.2)
Basos: 1 %
EOS (ABSOLUTE): 0.1 10*3/uL (ref 0.0–0.4)
Eos: 1 %
HCV Ab: <0.1 s/co ratio (ref 0.0–0.9)
HIV Screen 4th Generation wRfx: NONREACTIVE
Hematocrit: 36.3 % (ref 34.0–46.6)
Hemoglobin: 12.2 g/dL (ref 11.1–15.9)
Hepatitis B Surface Ag: NEGATIVE
Immature Grans (Abs): 0 10*3/uL (ref 0.0–0.1)
Immature Granulocytes: 0 %
Lymphocytes Absolute: 2.2 10*3/uL (ref 0.7–3.1)
Lymphs: 19 %
MCH: 30.9 pg (ref 26.6–33.0)
MCHC: 33.6 g/dL (ref 31.5–35.7)
MCV: 92 fL (ref 79–97)
Monocytes Absolute: 0.6 10*3/uL (ref 0.1–0.9)
Monocytes: 5 %
Neutrophils Absolute: 9 10*3/uL — ABNORMAL HIGH (ref 1.4–7.0)
Neutrophils: 74 %
Platelets: 222 10*3/uL (ref 150–450)
RBC: 3.95 x10E6/uL (ref 3.77–5.28)
RDW: 12.6 % (ref 11.7–15.4)
RPR Ser Ql: REACTIVE — AB
Rh Factor: POSITIVE
Rubella Antibodies, IGG: 6.77 index (ref >0.99)
WBC: 12 10*3/uL — ABNORMAL HIGH (ref 3.4–10.8)

## 2021-03-16 LAB — HCV INTERPRETATION

## 2021-03-16 LAB — RPR, QUANT+TP ABS (REFLEX)
Rapid Plasma Reagin, Quant: 1:64 {titer} — ABNORMAL HIGH
T Pallidum Abs: REACTIVE — AB

## 2021-03-16 LAB — HEMOGLOBIN A1C
Est. average glucose Bld gHb Est-mCnc: 111 mg/dL
Hgb A1c MFr Bld: 5.5 % (ref 4.8–5.6)

## 2021-03-17 ENCOUNTER — Telehealth: Payer: Self-pay

## 2021-03-17 LAB — CYTOLOGY - PAP
Chlamydia: NEGATIVE
Comment: NEGATIVE
Comment: NORMAL
Diagnosis: NEGATIVE
Neisseria Gonorrhea: NEGATIVE

## 2021-03-17 LAB — CULTURE, OB URINE

## 2021-03-17 LAB — URINE CULTURE, OB REFLEX

## 2021-03-17 NOTE — Telephone Encounter (Addendum)
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 Remainder of labs unremarkable.  Clinical pool please call patient and inform her that she has tested positive for syphilis and will need treatment with penicillin weekly x3. During our visit patient was specifically worried about testing for this so it may not come as a surprise. Please also complete any necessary public health reporting to the [facility], and also call Elfredia Nevins [phone]) at [facility] state.    Pt sent MyChart message this AM asking about results. Responded to message. Pt scheduled for nurse visit for treatment on 03/20/21. Elfredia Nevins with [facility] notified. STD form faxed to [facility] Department.

## 2021-03-20 ENCOUNTER — Other Ambulatory Visit: Payer: Self-pay

## 2021-03-20 ENCOUNTER — Ambulatory Visit (INDEPENDENT_AMBULATORY_CARE_PROVIDER_SITE_OTHER): Payer: Medicaid Other

## 2021-03-20 VITALS — BP 119/76 | HR 108 | Wt 110.7 lb

## 2021-03-20 DIAGNOSIS — O98111 Syphilis complicating pregnancy, first trimester: Secondary | ICD-10-CM

## 2021-03-20 MED ORDER — PENICILLIN G BENZATHINE 1200000 UNIT/2ML IM SUSY
2.4000 10*6.[IU] | PREFILLED_SYRINGE | Freq: Once | INTRAMUSCULAR | Status: AC
  Administered 2021-03-20: 2.4 10*6.[IU] via INTRAMUSCULAR

## 2021-03-20 NOTE — Progress Notes (Signed)
Kysa Leotis Pain here for Bicillin  Injection.  Injection administered without complication. Patient will return in one week for next injection.Pt tolerated well. Pt to make appt in 1 week with front office today for next injection. Pt verbalized understanding.  FHR: [address], [place] 03/20/2021  1:46 PM

## 2021-03-20 NOTE — Progress Notes (Signed)
Patient was assessed and managed by nursing staff during this encounter. I have reviewed the chart and agree with the documentation and plan. I have also made any necessary editorial changes.  Warden Fillers, MD 03/20/2021 3:55 PM

## 2021-03-21 ENCOUNTER — Encounter: Payer: Self-pay | Admitting: General Practice

## 2021-03-24 ENCOUNTER — Telehealth: Payer: Self-pay | Admitting: General Practice

## 2021-03-24 NOTE — Telephone Encounter (Signed)
Sherri Flores from the [facility] Department called to confirm patient is currently under going treatment for syphilis. Called and confirmed treatment with first dose on 10/17 and upcoming appts on 10/24 and 10/31 for next doses. He verbalized understanding & had no other questions.

## 2021-03-27 ENCOUNTER — Ambulatory Visit (INDEPENDENT_AMBULATORY_CARE_PROVIDER_SITE_OTHER): Payer: Medicaid Other

## 2021-03-27 ENCOUNTER — Other Ambulatory Visit: Payer: Self-pay

## 2021-03-27 VITALS — BP 133/85 | HR 93 | Wt 109.4 lb

## 2021-03-27 DIAGNOSIS — O0990 Supervision of high risk pregnancy, unspecified, unspecified trimester: Secondary | ICD-10-CM

## 2021-03-27 DIAGNOSIS — O98111 Syphilis complicating pregnancy, first trimester: Secondary | ICD-10-CM

## 2021-03-27 MED ORDER — PENICILLIN G BENZATHINE 1200000 UNIT/2ML IM SUSY
2.4000 10*6.[IU] | PREFILLED_SYRINGE | Freq: Once | INTRAMUSCULAR | Status: AC
  Administered 2021-03-27: 2.4 10*6.[IU] via INTRAMUSCULAR

## 2021-03-27 NOTE — Progress Notes (Signed)
Sherri Flores Pain here for  Bicillin-LA  injection for treatment of Syphilis. Reports that partner was treated today at [facility] Department; partner will follow up in 3 months for lab visit with [facility]. Injection administered without complication. Patient will return in one week for final injection in series of 3.  Pt reports bleeding for a couple hours that was heavier than spotting on Saturday. No bleeding today. Denies any pain. FHR 150. I explained to pt that if she experiences any future vaginal bleeding she should go to the Maternity Assessment Unit for evaluation.   Marjo Bicker, RN 03/27/2021  12:11 PM

## 2021-03-29 NOTE — Progress Notes (Signed)
Chart reviewed for nurse visit. Agree with plan of care.   Warner Mccreedy, MD 03/29/2021 9:39 AM

## 2021-03-31 ENCOUNTER — Encounter: Payer: Self-pay | Admitting: Family Medicine

## 2021-03-31 DIAGNOSIS — Z148 Genetic carrier of other disease: Secondary | ICD-10-CM

## 2021-03-31 HISTORY — DX: Genetic carrier of other disease: Z14.8

## 2021-04-03 ENCOUNTER — Ambulatory Visit (INDEPENDENT_AMBULATORY_CARE_PROVIDER_SITE_OTHER): Payer: Medicaid Other | Admitting: *Deleted

## 2021-04-03 ENCOUNTER — Other Ambulatory Visit: Payer: Self-pay

## 2021-04-03 VITALS — BP 125/75 | HR 104 | Ht 60.0 in | Wt 113.0 lb

## 2021-04-03 DIAGNOSIS — O0990 Supervision of high risk pregnancy, unspecified, unspecified trimester: Secondary | ICD-10-CM

## 2021-04-03 DIAGNOSIS — O98111 Syphilis complicating pregnancy, first trimester: Secondary | ICD-10-CM | POA: Diagnosis not present

## 2021-04-03 MED ORDER — PENICILLIN G BENZATHINE 1200000 UNIT/2ML IM SUSY
2.4000 10*6.[IU] | PREFILLED_SYRINGE | Freq: Once | INTRAMUSCULAR | Status: AC
  Administered 2021-04-03: 2.4 10*6.[IU] via INTRAMUSCULAR

## 2021-04-03 NOTE — Progress Notes (Signed)
Here for 3rd penicillin for Syphilis . FHR 152. Injection given in each hip, patient tolerated injections well. Reviewed [facility] results with patient and gave her recommendations to schedule genetic counseling thru [facility] and partner testing. Partner with patient and they requested partner kit. Instructions reviewed with her partner.  Darchelle Nunes,RN

## 2021-04-07 NOTE — Progress Notes (Signed)
Patient was assessed and managed by nursing staff during this encounter. I have reviewed the chart and agree with the documentation and plan. I have also made any necessary editorial changes.  Warden Fillers, MD 04/07/2021 8:28 PM

## 2021-04-10 ENCOUNTER — Other Ambulatory Visit: Payer: Self-pay

## 2021-04-10 ENCOUNTER — Ambulatory Visit (INDEPENDENT_AMBULATORY_CARE_PROVIDER_SITE_OTHER): Payer: Medicaid Other | Admitting: Obstetrics and Gynecology

## 2021-04-10 VITALS — BP 113/76 | HR 105 | Wt 114.3 lb

## 2021-04-10 DIAGNOSIS — Z148 Genetic carrier of other disease: Secondary | ICD-10-CM

## 2021-04-10 DIAGNOSIS — O98111 Syphilis complicating pregnancy, first trimester: Secondary | ICD-10-CM

## 2021-04-10 DIAGNOSIS — O0990 Supervision of high risk pregnancy, unspecified, unspecified trimester: Secondary | ICD-10-CM

## 2021-04-10 DIAGNOSIS — Z98891 History of uterine scar from previous surgery: Secondary | ICD-10-CM

## 2021-04-10 NOTE — Progress Notes (Signed)
   PRENATAL VISIT NOTE  Subjective:  Sherri Flores is a 30 y.o. [id number] at 45w3dbeing seen today for ongoing prenatal care.  She is currently monitored for the following issues for this high-risk pregnancy and has Preterm delivery, delivered; Supervision of high risk pregnancy, antepartum; History of VBAC; Syphilis affecting pregnancy in first trimester; and Carrier of spinal muscular atrophy on their problem list.  Patient reports no complaints except some round ligament pain.  Contractions: Not present.  .  Movement: Present. Denies leaking of fluid.   The following portions of the patient's history were reviewed and updated as appropriate: allergies, current medications, past family history, past medical history, past social history, past surgical history and problem list.   Objective:   Vitals:   04/10/21 1122  BP: 113/76  Pulse: (!) 105  Weight: 114 lb 4.8 oz (51.8 kg)    Fetal Status: Fetal Heart Rate (bpm): 152   Movement: Present     General:  Alert, oriented and cooperative. Patient is in no acute distress.  Skin: Skin is warm and dry. No rash noted.   Cardiovascular: Normal heart rate noted  Respiratory: Normal respiratory effort, no problems with respiration noted  Abdomen: Soft, gravid, appropriate for gestational age.  Pain/Pressure: Absent     Pelvic: Cervical exam deferred        Extremities: Normal range of motion.  Edema: None  Mental Status: Normal mood and affect. Normal behavior. Normal judgment and thought content.   Assessment and Plan:  Pregnancy: [id number] 167w3d. Carrier of spinal muscular atrophy - Partner testing kit given on 10/31 -- he hasn't done it yet but she says they will complete it during the pregnancy.   2. Syphilis affecting pregnancy in first trimester - Will still get RPR at standard times, but will still expect elevated titers. Will expect four fold decrease in 6-12 months. PL updated. Partner was [facility] to [facility] and  [facility], 10/24, 10/31  3. History of VBAC - Desires TOLAC again. Will need TOLAC consent   4. Supervision of high risk pregnancy, antepartum - MSAFP done today - Maternity belt given today  5. Preterm delivery, delivered - Declines Makena  Preterm labor symptoms and general obstetric precautions including but not limited to vaginal bleeding, contractions, leaking of fluid and fetal movement were reviewed in detail with the patient. Please refer to After Visit Summary for other counseling recommendations.   No follow-ups on file.  Future Appointments  Date Time Provider [place]12/06/2020  1:15 PM [facility]12/06/2020  1:30 PM [facility] US3 [facility]  PaRadene GunningMD

## 2021-04-12 LAB — AFP, SERUM, OPEN SPINA BIFIDA
AFP MoM: 1.21
AFP Value: 51.5 ng/mL
Gest. Age on Collection Date: 15.3 weeks
Maternal Age At EDD: 29.9 yr
OSBR Risk 1 IN: 10000
Test Results:: NEGATIVE
Weight: 114 [lb_av]

## 2021-05-04 ENCOUNTER — Ambulatory Visit: Payer: Medicaid Other | Attending: Family Medicine | Admitting: Maternal & Fetal Medicine

## 2021-05-04 ENCOUNTER — Other Ambulatory Visit: Payer: Self-pay

## 2021-05-04 ENCOUNTER — Other Ambulatory Visit: Payer: Self-pay | Admitting: *Deleted

## 2021-05-04 ENCOUNTER — Ambulatory Visit: Payer: Medicaid Other | Attending: Family Medicine

## 2021-05-04 ENCOUNTER — Ambulatory Visit: Payer: Medicaid Other | Admitting: *Deleted

## 2021-05-04 VITALS — BP 124/66 | HR 100

## 2021-05-04 DIAGNOSIS — O0990 Supervision of high risk pregnancy, unspecified, unspecified trimester: Secondary | ICD-10-CM

## 2021-05-04 DIAGNOSIS — O09892 Supervision of other high risk pregnancies, second trimester: Secondary | ICD-10-CM

## 2021-05-04 DIAGNOSIS — O98112 Syphilis complicating pregnancy, second trimester: Secondary | ICD-10-CM | POA: Diagnosis not present

## 2021-05-04 DIAGNOSIS — O34219 Maternal care for unspecified type scar from previous cesarean delivery: Secondary | ICD-10-CM

## 2021-05-04 DIAGNOSIS — O43192 Other malformation of placenta, second trimester: Secondary | ICD-10-CM

## 2021-05-05 NOTE — Progress Notes (Signed)
MFM Brief Note  Ms. Molden is a 30 yo G4P2 who is here at 19 weeks with an EDD of 09/25/21 she is seen at the request of Dr. Merian Capron  A single intrauterine pregnancy here for a detailed anatomy due to first trimester expsosure to syphillis Normal anatomy with measurements consistent with dates There is good fetal movement and amniotic fluid volume Suboptimal views of the fetal anatomy were obtained secondary to fetal position.  Today we also observe a marginal cord insertion. I discussed that a marginal cord insertion is a normal variant of cord insertion on the placenta however it is associated with fetal growth delays. 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   I reviewed her current diagnosis, treatment and management.  We discussed the potential complications of syphilis in pregnancy. Syphilis infection is associated with a significant risk of perinatal transmission in the primary (50%) or secondary (40%) phases.  Primary disease is characterized by presence of a painless chancre that lasts for 3-6 weeks. Secondary syphilis occurs in 25% of untreated primary infections. It is characterized by a generalized maculopapular skin rash involving the palms and soles and mucous membranes, but usually sparing the face.. Generalized lymphadenopathy accompanies the skin rash. Additional clinical features include fever, pharyngitis, weight loss, and large genital lesions called condylomata lata. The rash of secondary syphilis typically resolves spontaneously within two to six weeks. Secondary syphilis is commonly the stage when women present to a health care provider.  Latent syphilis is generally subclinical, although the characteristic rash of secondary syphilis may recur periodically. This risk of vertical transmission in latent syphilis is  about 10%, although the risk is highest in the 1st 4 years after acquiring the infection.  Tertiary syphilis occurs in approximately one-third of untreated patients, but is now rarely seen since most patients are treated either deliberately or inadvertently when receiving penicillin for other indications. Slowly progressive signs and symptoms characterize tertiary syphilis. Clinical manifestations include gumma formation, cardiovascular disease, and/or CNS changes (neurosyphilis). These symptoms usually occur 5-10 years after the disease becomes latent.   Evidence of vertical transmission on ultrasound includes a large, hydropic placenta and fetal growth restriction. Fetal hydrops and hepatosplenomegaly may also be observed. There is an increased risk of perinatal death, low birth weight, and long-term neurological impairment in the infant. I reassured her that we did not observe any of these findings today.  Evidence of maternal infection requires treatment. Indications for treatment include sexual contact with a confirmed syphilis carrier (she affirms that her partner was screened and treated), visualization of spirochetes on darkfield microscopy, + treponemal antibody test (FTA-ABS), or prior infection with a titer > 1:4, or rising titers of RPR or VRDL. Primary syphilis (documented negative serology in the past year or clear evidence of primary infection) can be treated with two weekly doses of benzathene penicillin. In all other cases the infection should be presumed to be latant and 3 weekly doses of benzathene penicillin are indicated. (This treatment has been recieved by Ms. Manson Passey).   Nontreponemal antibody serologic titers should be checked at 1, 3, 6, 12, and 24 months following treatment; the same type of nontreponemal test should be used before and after treatment to optimize comparison of results. Titers should decrease four-fold by six months post therapy and become nonreactive by 12 to 24  months.  Titers  that rise (a fourfold rise is diagnostic) or do not decrease appropriately suggest either treatment failure or reinfection. The treatment regimen should be repeated in these cases. Consideration should also be given to performing a lumbar puncture to evaluate for CNS involvement.  Pregnancies complicated by untreated syphilis early in pregnancy are at increased risk of several adverse outcomes, although approximately 20% of children born to mothers with untreated syphilis will be normal. Vertical transmission can occur at any stage of the disease, but the risk varies depending on the duration of maternal infection. In women with untreated syphilis who deliver a term live born infant, the risk of congenital infection is 50% for primary and secondary syphilis, 40% for early latent syphilis and 10% for late syphilis. Overall, the incidence of congenital syphilis corresponds to the incidence of disease in women. Most congenital cases are due to lack of prenatal care or late entry into prenatal care.   Ms. Cansler reassured me that she will follow up with her lab draws.  I spent 20 minutes with > 50% in face to face consultation.  Novella Olive, MD

## 2021-05-08 ENCOUNTER — Telehealth (INDEPENDENT_AMBULATORY_CARE_PROVIDER_SITE_OTHER): Payer: Medicaid Other | Admitting: Obstetrics & Gynecology

## 2021-05-08 DIAGNOSIS — O0990 Supervision of high risk pregnancy, unspecified, unspecified trimester: Secondary | ICD-10-CM

## 2021-05-08 DIAGNOSIS — O98111 Syphilis complicating pregnancy, first trimester: Secondary | ICD-10-CM

## 2021-05-08 DIAGNOSIS — Z98891 History of uterine scar from previous surgery: Secondary | ICD-10-CM

## 2021-05-08 DIAGNOSIS — O34219 Maternal care for unspecified type scar from previous cesarean delivery: Secondary | ICD-10-CM

## 2021-05-08 DIAGNOSIS — Z3A19 19 weeks gestation of pregnancy: Secondary | ICD-10-CM

## 2021-05-08 DIAGNOSIS — O98112 Syphilis complicating pregnancy, second trimester: Secondary | ICD-10-CM

## 2021-05-08 NOTE — Progress Notes (Signed)
I connected with  Sherri Flores on 05/08/21 at  1:15 PM EST by MyChart Virtual Video Visit and verified that I am speaking with the correct person using two identifiers.   I discussed the limitations, risks, security and privacy concerns of performing an evaluation and management service by telephone and the availability of in person appointments. I also discussed with the patient that there may be a patient responsible charge related to this service. The patient expressed understanding and agreed to proceed.  Guy Begin, CMA 05/08/2021  1:49 PM

## 2021-05-08 NOTE — Progress Notes (Signed)
Patient wanted to what medicine can she take for her cough

## 2021-05-08 NOTE — Progress Notes (Signed)
    TELEHEALTH OBSTETRICS VISIT ENCOUNTER NOTE  Provider location: Center for [facility]   Patient location: Home  I connected with Sherri Flores on 05/08/21 at  1:15 PM EST by telephone at home and verified that I am speaking with the correct person using two identifiers. Of note, unable to do video encounter due to technical difficulties.    I discussed the limitations, risks, security and privacy concerns of performing an evaluation and management service by telephone and the availability of in person appointments. I also discussed with the patient that there may be a patient responsible charge related to this service. The patient expressed understanding and agreed to proceed.  Subjective:  Sherri Flores is a 30 y.o. [id number] at [redacted]w[redacted]d being followed for ongoing prenatal care.  She is currently monitored for the following issues for this high-risk pregnancy and has Preterm delivery, delivered; Supervision of high risk pregnancy, antepartum; History of VBAC; Syphilis affecting pregnancy in first trimester; and Carrier of spinal muscular atrophy on their problem list.  Patient reports  nasal congestion and cough . Reports fetal movement. Denies any contractions, bleeding or leaking of fluid.   The following portions of the patient's history were reviewed and updated as appropriate: allergies, current medications, past family history, past medical history, past social history, past surgical history and problem list.   Objective:  Last menstrual period 12/23/2020. General:  Alert, oriented and cooperative.   Mental Status: Normal mood and affect perceived. Normal judgment and thought content.  Rest of physical exam deferred due to type of encounter  Assessment and Plan:  Pregnancy: [id number] at [redacted]w[redacted]d 1. Supervision of high risk pregnancy, antepartum Doing well  2. Syphilis affecting pregnancy in first trimester S/p ABX course  3. History of  VBAC   Preterm labor symptoms and general obstetric precautions including but not limited to vaginal bleeding, contractions, leaking of fluid and fetal movement were reviewed in detail with the patient.  I discussed the assessment and treatment plan with the patient. The patient was provided an opportunity to ask questions and all were answered. The patient agreed with the plan and demonstrated an understanding of the instructions. The patient was advised to call back or seek an in-person office evaluation/go to [facility]U at [facility] for any urgent or concerning symptoms. Please refer to After Visit Summary for other counseling recommendations.   I provided 13 minutes of non-face-to-face time during this encounter.  Return in about 4 weeks (around 06/05/2021).  Future Appointments  Date Time Provider Department Center  06/15/2021  9:15 AM [facility] NURSE [facility]  06/15/2021  9:30 AM [facility] US3 [facility]    Scheryl Darter, MD Center for [facility], [facility] [place] [facility]

## 2021-05-10 ENCOUNTER — Encounter: Payer: Self-pay | Admitting: Family Medicine

## 2021-06-04 NOTE — L&D Delivery Note (Addendum)
OB/GYN Faculty Practice Delivery Note ? ?[name] is a 31 y.o. [id number] s/p VBAC at [redacted]w[redacted]d. She was admitted for PPROM.  ? ?ROM: 60h 34m with bloody fluid ?GBS Status: Positive>Ampicillin ?Maximum Maternal Temperature: 98.4 ? ?Labor Progress: ?PPROM 3/4 @ 2345, PTL expectantly managed  ? ?Delivery Date/Time: 1240 ?Delivery: Called to room and patient was complete and pushing. Head delivered LOA. No nuchal cord present. Shoulder and body delivered in usual fashion. Infant placed on mother's abdomen, dried and stimulated, suctioning performed and NICU team present during delivery. Cord clamped x 2 after 1-minute delay, and cut by father of baby under my direct supervision. Cord blood drawn. Placenta delivered spontaneously with gentle cord traction. Fundus firm with massage and Pitocin. Labia, perineum, vagina, and cervix were inspected, L labial and periurethral abrasions present that were hemostatic.  ? ?Placenta: complete, three vessel cord appreciated, hematoma visualized on placenta ~ 5 cm- sent to pathology ?Complications: ROM > 24 hours ?Lacerations: Periurethral and L labial abrasion-hemostatic ?EBL: 75 mL ?Analgesia: Epidural ? ?Postpartum Planning ?[X]  message to sent to schedule follow-up  ?[X]  vaccines UTD ? ?Infant: girl  APGARs pending  1910 g (see NICU consult note) ? ? , MD ?[facility] PGY-1 ?Center for [facility]  ? ? ?Midwife attestation: ?I was gloved and present for delivery in its entirety and I agree with the above resident's note. ? ?[employer], CNM ?1:36 PM ? ?  ?

## 2021-06-06 ENCOUNTER — Ambulatory Visit (INDEPENDENT_AMBULATORY_CARE_PROVIDER_SITE_OTHER): Payer: Medicaid Other | Admitting: Family Medicine

## 2021-06-06 ENCOUNTER — Other Ambulatory Visit: Payer: Self-pay

## 2021-06-06 ENCOUNTER — Encounter: Payer: Self-pay | Admitting: Family Medicine

## 2021-06-06 VITALS — BP 103/69 | HR 97 | Wt 122.0 lb

## 2021-06-06 DIAGNOSIS — O98111 Syphilis complicating pregnancy, first trimester: Secondary | ICD-10-CM

## 2021-06-06 DIAGNOSIS — O43199 Other malformation of placenta, unspecified trimester: Secondary | ICD-10-CM

## 2021-06-06 DIAGNOSIS — Z98891 History of uterine scar from previous surgery: Secondary | ICD-10-CM

## 2021-06-06 DIAGNOSIS — Z148 Genetic carrier of other disease: Secondary | ICD-10-CM

## 2021-06-06 DIAGNOSIS — O0990 Supervision of high risk pregnancy, unspecified, unspecified trimester: Secondary | ICD-10-CM

## 2021-06-06 NOTE — Patient Instructions (Signed)

## 2021-06-06 NOTE — Progress Notes (Signed)
° °  Subjective:  Sherri Flores is a 31 y.o. [id number] at [redacted]w[redacted]d being seen today for ongoing prenatal care.  She is currently monitored for the following issues for this high-risk pregnancy and has Preterm delivery, delivered; Supervision of high risk pregnancy, antepartum; History of VBAC; Syphilis affecting pregnancy in first trimester; and Carrier of spinal muscular atrophy on their problem list.  Patient reports no complaints.  Contractions: Not present. Vag. Bleeding: None.  Movement: Present. Denies leaking of fluid.   The following portions of the patient's history were reviewed and updated as appropriate: allergies, current medications, past family history, past medical history, past social history, past surgical history and problem list. Problem list updated.  Objective:   Vitals:   06/06/21 1542  BP: 103/69  Pulse: 97  Weight: 122 lb (55.3 kg)    Fetal Status: Fetal Heart Rate (bpm): 156   Movement: Present     General:  Alert, oriented and cooperative. Patient is in no acute distress.  Skin: Skin is warm and dry. No rash noted.   Cardiovascular: Normal heart rate noted  Respiratory: Normal respiratory effort, no problems with respiration noted  Abdomen: Soft, gravid, appropriate for gestational age. Pain/Pressure: Absent     Pelvic: Vag. Bleeding: None     Cervical exam deferred        Extremities: Normal range of motion.  Edema: None  Mental Status: Normal mood and affect. Normal behavior. Normal judgment and thought content.   Urinalysis:      Assessment and Plan:  Pregnancy: [id number] at [redacted]w[redacted]d  1. Supervision of high risk pregnancy, antepartum BP and FHR normal Advised of fasting labs for next visit  2. Syphilis affecting pregnancy in first trimester S/p bicillin x3 Normal fetal US to date  3. History of VBAC CS>VBAC Desires TOLAC  4. Carrier of spinal muscular atrophy Given partner testing kit 10/31, has not completed  5. Marginal cord insertion Following  w MFM Normal growth to date  Preterm labor symptoms and general obstetric precautions including but not limited to vaginal bleeding, contractions, leaking of fluid and fetal movement were reviewed in detail with the patient. Please refer to After Visit Summary for other counseling recommendations.  Return in 4 weeks (on 07/04/2021) for ob visit, [place].   Clarnce Flock, MD

## 2021-06-15 ENCOUNTER — Ambulatory Visit: Payer: Medicaid Other | Admitting: *Deleted

## 2021-06-15 ENCOUNTER — Other Ambulatory Visit: Payer: Self-pay | Admitting: *Deleted

## 2021-06-15 ENCOUNTER — Encounter: Payer: Self-pay | Admitting: *Deleted

## 2021-06-15 ENCOUNTER — Ambulatory Visit: Payer: Medicaid Other | Attending: Maternal & Fetal Medicine

## 2021-06-15 ENCOUNTER — Other Ambulatory Visit: Payer: Self-pay

## 2021-06-15 VITALS — BP 118/71 | HR 96

## 2021-06-15 DIAGNOSIS — Z362 Encounter for other antenatal screening follow-up: Secondary | ICD-10-CM | POA: Diagnosis not present

## 2021-06-15 DIAGNOSIS — O09212 Supervision of pregnancy with history of pre-term labor, second trimester: Secondary | ICD-10-CM

## 2021-06-15 DIAGNOSIS — O0990 Supervision of high risk pregnancy, unspecified, unspecified trimester: Secondary | ICD-10-CM | POA: Insufficient documentation

## 2021-06-15 DIAGNOSIS — O34219 Maternal care for unspecified type scar from previous cesarean delivery: Secondary | ICD-10-CM | POA: Diagnosis present

## 2021-06-15 DIAGNOSIS — Z3A24 24 weeks gestation of pregnancy: Secondary | ICD-10-CM

## 2021-06-15 DIAGNOSIS — O43192 Other malformation of placenta, second trimester: Secondary | ICD-10-CM | POA: Insufficient documentation

## 2021-06-15 DIAGNOSIS — O09892 Supervision of other high risk pregnancies, second trimester: Secondary | ICD-10-CM | POA: Diagnosis present

## 2021-07-05 ENCOUNTER — Ambulatory Visit (INDEPENDENT_AMBULATORY_CARE_PROVIDER_SITE_OTHER): Payer: Medicaid Other | Admitting: Family Medicine

## 2021-07-05 ENCOUNTER — Encounter: Payer: Self-pay | Admitting: Family Medicine

## 2021-07-05 ENCOUNTER — Other Ambulatory Visit: Payer: Self-pay

## 2021-07-05 VITALS — BP 105/71 | HR 81 | Wt 123.4 lb

## 2021-07-05 DIAGNOSIS — Z148 Genetic carrier of other disease: Secondary | ICD-10-CM

## 2021-07-05 DIAGNOSIS — O0990 Supervision of high risk pregnancy, unspecified, unspecified trimester: Secondary | ICD-10-CM

## 2021-07-05 DIAGNOSIS — O43199 Other malformation of placenta, unspecified trimester: Secondary | ICD-10-CM

## 2021-07-05 DIAGNOSIS — Z98891 History of uterine scar from previous surgery: Secondary | ICD-10-CM

## 2021-07-05 DIAGNOSIS — O98111 Syphilis complicating pregnancy, first trimester: Secondary | ICD-10-CM

## 2021-07-05 DIAGNOSIS — Z23 Encounter for immunization: Secondary | ICD-10-CM

## 2021-07-05 NOTE — Progress Notes (Signed)
° °  Subjective:  Sherri Flores is a 31 y.o. [id number] at [redacted]w[redacted]d being seen today for ongoing prenatal care.  She is currently monitored for the following issues for this high-risk pregnancy and has Preterm delivery, delivered; Supervision of high risk pregnancy, antepartum; History of VBAC; Syphilis affecting pregnancy in first trimester; Carrier of spinal muscular atrophy; and Marginal insertion of umbilical cord affecting management of mother on their problem list.  Patient reports no complaints.  Contractions: Not present. Vag. Bleeding: None.  Movement: Present. Denies leaking of fluid.   The following portions of the patient's history were reviewed and updated as appropriate: allergies, current medications, past family history, past medical history, past social history, past surgical history and problem list. Problem list updated.  Objective:   Vitals:   07/05/21 1038  BP: 105/71  Pulse: 81  Weight: 123 lb 6.4 oz (56 kg)    Fetal Status: Fetal Heart Rate (bpm): 148   Movement: Present     General:  Alert, oriented and cooperative. Patient is in no acute distress.  Skin: Skin is warm and dry. No rash noted.   Cardiovascular: Normal heart rate noted  Respiratory: Normal respiratory effort, no problems with respiration noted  Abdomen: Soft, gravid, appropriate for gestational age. Pain/Pressure: Present     Pelvic: Vag. Bleeding: None     Cervical exam deferred        Extremities: Normal range of motion.  Edema: None  Mental Status: Normal mood and affect. Normal behavior. Normal judgment and thought content.   Urinalysis:      Assessment and Plan:  Pregnancy: [id number] at [redacted]w[redacted]d  1. Supervision of high risk pregnancy, antepartum BP and FHR Fasting but not scheduled with lab, will reschedule Offered TDaP, accepts  2. History of VBAC Desires TOLAC Papers signed today  3. Syphilis affecting pregnancy in first trimester S/p treatment Recheck titer with next lab check  4.  Carrier of spinal muscular atrophy Has partner testing kit, not yet completed  5. Marginal insertion of umbilical cord affecting management of mother Following w MFM Next growth [place] 07/20/21 Normal EFW to date  6. Preterm delivery, delivered Declined makena previously  Preterm labor symptoms and general obstetric precautions including but not limited to vaginal bleeding, contractions, leaking of fluid and fetal movement were reviewed in detail with the patient. Please refer to After Visit Summary for other counseling recommendations.  Return in 2 weeks (on 07/19/2021) for [facility], ob visit, needs MD.   Clarnce Flock, MD

## 2021-07-05 NOTE — Patient Instructions (Signed)

## 2021-07-05 NOTE — Progress Notes (Deleted)
Needs 2 hr GTT?

## 2021-07-06 ENCOUNTER — Encounter: Payer: Self-pay | Admitting: *Deleted

## 2021-07-11 ENCOUNTER — Other Ambulatory Visit: Payer: Self-pay

## 2021-07-11 ENCOUNTER — Other Ambulatory Visit: Payer: Medicaid Other

## 2021-07-11 DIAGNOSIS — O0990 Supervision of high risk pregnancy, unspecified, unspecified trimester: Secondary | ICD-10-CM

## 2021-07-12 LAB — GLUCOSE TOLERANCE, 2 HOURS W/ 1HR
Glucose, 1 hour: 189 mg/dL — ABNORMAL HIGH (ref 70–179)
Glucose, 2 hour: 83 mg/dL (ref 70–152)
Glucose, Fasting: 83 mg/dL (ref 70–91)

## 2021-07-12 LAB — CBC
Hematocrit: 37.2 % (ref 34.0–46.6)
Hemoglobin: 12.5 g/dL (ref 11.1–15.9)
MCH: 31.6 pg (ref 26.6–33.0)
MCHC: 33.6 g/dL (ref 31.5–35.7)
MCV: 94 fL (ref 79–97)
Platelets: 154 10*3/uL (ref 150–450)
RBC: 3.95 x10E6/uL (ref 3.77–5.28)
RDW: 11.9 % (ref 11.7–15.4)
WBC: 14.3 10*3/uL — ABNORMAL HIGH (ref 3.4–10.8)

## 2021-07-12 LAB — RPR, QUANT+TP ABS (REFLEX)
Rapid Plasma Reagin, Quant: 1:32 {titer} — ABNORMAL HIGH
T Pallidum Abs: REACTIVE — AB

## 2021-07-12 LAB — HIV ANTIBODY (ROUTINE TESTING W REFLEX): HIV Screen 4th Generation wRfx: NONREACTIVE

## 2021-07-12 LAB — RPR: RPR Ser Ql: REACTIVE — AB

## 2021-07-13 ENCOUNTER — Telehealth: Payer: Self-pay

## 2021-07-13 ENCOUNTER — Encounter: Payer: Self-pay | Admitting: Family Medicine

## 2021-07-13 DIAGNOSIS — O24419 Gestational diabetes mellitus in pregnancy, unspecified control: Secondary | ICD-10-CM | POA: Insufficient documentation

## 2021-07-13 HISTORY — DX: Gestational diabetes mellitus in pregnancy, unspecified control: O24.419

## 2021-07-13 MED ORDER — ACCU-CHEK GUIDE W/DEVICE KIT: 1.0000 | PACK | Freq: Four times a day (QID) | 0 refills | Status: DC

## 2021-07-13 MED ORDER — GLUCOSE BLOOD VI STRP: ORAL_STRIP | 12 refills | Status: DC

## 2021-07-13 MED ORDER — ACCU-CHEK SOFTCLIX LANCETS MISC: 12 refills | Status: DC

## 2021-07-13 NOTE — Telephone Encounter (Signed)
Call placed to pt. Spoke with pt. Pt given results and recommendations per Dr Crissie Reese on GTT abnormal results. Pt very anxious and tearful with the news of GDM. Pt advised will have appt to explain more about checking sugars and diet control. Pt verbalized understanding and agreeable to plan of care.  Pt scheduled with Marylene Land on 2/14 at 815am and GDM supplies sent to pharmacy on file to pick up and bring to appt. Pt verbalized understanding.  Judeth Cornfield, RN

## 2021-07-13 NOTE — Telephone Encounter (Signed)
-----   Message from Sherri Maples, MD sent at 07/13/2021  1:26 PM EST ----- 28 wk labs show downtrending RPR titer, have already addressed that in a different message with the patient Also has abnormal GTT, clinical staff please send testing supplies and call her to inform her of this result, she is already feeling anxious about her titer level Remainder of labs unremarkable

## 2021-07-17 ENCOUNTER — Other Ambulatory Visit: Payer: Self-pay | Admitting: General Practice

## 2021-07-17 DIAGNOSIS — O24410 Gestational diabetes mellitus in pregnancy, diet controlled: Secondary | ICD-10-CM

## 2021-07-18 ENCOUNTER — Encounter: Payer: Medicaid Other | Attending: Family Medicine | Admitting: Registered"

## 2021-07-18 ENCOUNTER — Ambulatory Visit (INDEPENDENT_AMBULATORY_CARE_PROVIDER_SITE_OTHER): Payer: Medicaid Other | Admitting: Registered"

## 2021-07-18 ENCOUNTER — Other Ambulatory Visit: Payer: Self-pay

## 2021-07-18 DIAGNOSIS — O24410 Gestational diabetes mellitus in pregnancy, diet controlled: Secondary | ICD-10-CM

## 2021-07-18 DIAGNOSIS — Z3A00 Weeks of gestation of pregnancy not specified: Secondary | ICD-10-CM | POA: Insufficient documentation

## 2021-07-18 NOTE — Progress Notes (Signed)
Patient was seen for Gestational Diabetes self-management on 07/18/21  Start time 0830 and End time 0915  (visit started 15 min late, needed to provide abbreviated education)  Estimated due date: 09/29/21; [redacted]w[redacted]d  Clinical: Medications: prenatal vitamins Medical History: reviewed Labs: OGTT 1 hr 189(H) , A1c 5.5%   Dietary and Lifestyle History: Pt states she drinks water all day. Pt reports she doesn't drink much milk because it tends to make her nauseous. Pt reports she is not lactose intolerant    Physical Activity: not assessed Stress: not assessed Sleep: not assessed  24 hr Recall: not assessed   NUTRITION INTERVENTION  Nutrition education (E-1) on the following topics:   Initial Follow-up  [x]  []  Definition of Gestational Diabetes [x]  []  Why dietary management is important in controlling blood glucose [x]  []  Effects each nutrient has on blood glucose levels []  []  Simple carbohydrates vs complex carbohydrates []  []  Fluid intake [x]  []  Creating a balanced meal plan [x]  []  Carbohydrate counting  [x]  []  When to check blood glucose levels [x]  []  Proper blood glucose monitoring techniques []  []  Effect of stress and stress reduction techniques  []  []  Exercise effect on blood glucose levels, appropriate exercise during pregnancy []  []  Importance of limiting caffeine and abstaining from alcohol and smoking []  []  Medications used for blood sugar control during pregnancy []  []  Hypoglycemia and rule of 15 []  []  Postpartum self care  Patient brought meter to visit for instruction CBG: 85 mg/dL (fasting)  Patient instructed to monitor glucose levels: FBS: 60 - ? 95 mg/dL (some clinics use 90 for cutoff) 1 hour: ? 140 mg/dL 2 hour: ? mg/dL  Patient received handouts: Nutrition Diabetes and Pregnancy Carbohydrate Counting List  Patient will be seen for follow-up as needed.

## 2021-07-19 ENCOUNTER — Ambulatory Visit (INDEPENDENT_AMBULATORY_CARE_PROVIDER_SITE_OTHER): Payer: Medicaid Other | Admitting: Family Medicine

## 2021-07-19 ENCOUNTER — Ambulatory Visit: Payer: Medicaid Other | Attending: Maternal & Fetal Medicine

## 2021-07-19 ENCOUNTER — Ambulatory Visit: Payer: Medicaid Other | Admitting: *Deleted

## 2021-07-19 VITALS — BP 114/64 | HR 85

## 2021-07-19 VITALS — BP 116/74 | HR 76 | Wt 132.5 lb

## 2021-07-19 DIAGNOSIS — O0990 Supervision of high risk pregnancy, unspecified, unspecified trimester: Secondary | ICD-10-CM

## 2021-07-19 DIAGNOSIS — O43193 Other malformation of placenta, third trimester: Secondary | ICD-10-CM

## 2021-07-19 DIAGNOSIS — O43199 Other malformation of placenta, unspecified trimester: Secondary | ICD-10-CM

## 2021-07-19 DIAGNOSIS — O24410 Gestational diabetes mellitus in pregnancy, diet controlled: Secondary | ICD-10-CM

## 2021-07-19 DIAGNOSIS — Z98891 History of uterine scar from previous surgery: Secondary | ICD-10-CM

## 2021-07-19 DIAGNOSIS — Z3A29 29 weeks gestation of pregnancy: Secondary | ICD-10-CM | POA: Diagnosis not present

## 2021-07-19 DIAGNOSIS — O98111 Syphilis complicating pregnancy, first trimester: Secondary | ICD-10-CM

## 2021-07-19 DIAGNOSIS — Z148 Genetic carrier of other disease: Secondary | ICD-10-CM

## 2021-07-19 DIAGNOSIS — O43192 Other malformation of placenta, second trimester: Secondary | ICD-10-CM | POA: Insufficient documentation

## 2021-07-19 NOTE — Progress Notes (Signed)
Fasting BS 72 After Breakfast 86

## 2021-07-19 NOTE — Progress Notes (Signed)
Subjective:  °Sherri Flores is a 31 y.o. G4P1112 at [redacted]w[redacted]d being seen today for ongoing prenatal care.  She is currently monitored for the following issues for this high-risk pregnancy and has Preterm delivery, delivered; Supervision of high risk pregnancy, antepartum; History of VBAC; Syphilis affecting pregnancy in first trimester; Carrier of spinal muscular atrophy; Marginal insertion of umbilical cord affecting management of mother; and GDM (gestational diabetes mellitus) on their problem list. ° °Patient reports no complaints.  Contractions: Not present. Vag. Bleeding: None.  Movement: Present. Denies leaking of fluid.  ° °The following portions of the patient's history were reviewed and updated as appropriate: allergies, current medications, past family history, past medical history, past social history, past surgical history and problem list. Problem list updated. ° °Objective:  ° °Vitals:  ° 07/19/21 1532  °BP: 116/74  °Pulse: 76  °Weight: 132 lb 8 oz (60.1 kg)  ° ° °Fetal Status: Fetal Heart Rate (bpm): 147   Movement: Present    ° °General:  Alert, oriented and cooperative. Patient is in no acute distress.  °Skin: Skin is warm and dry. No rash noted.   °Cardiovascular: Normal heart rate noted  °Respiratory: Normal respiratory effort, no problems with respiration noted  °Abdomen: Soft, gravid, appropriate for gestational age. Pain/Pressure: Absent     °Pelvic: Vag. Bleeding: None     °Cervical exam deferred        °Extremities: Normal range of motion.  Edema: None  °Mental Status: Normal mood and affect. Normal behavior. Normal judgment and thought content.  ° °Urinalysis:     ° °Assessment and Plan:  °Pregnancy: G4P1112 at [redacted]w[redacted]d ° °1. Supervision of high risk pregnancy, antepartum °BP and FHR normal ° °2. Diet controlled gestational diabetes mellitus (GDM) in third trimester °Forgot to bring log but only just started checking a few days ago °Controlled on recall °US earlier today showed EFW 25%, AFI  12 cm, possible subchorionic hemorrhage that is old from first trimester bleed ° °3. Preterm delivery, delivered °Previously declined Makena ° °4. History of VBAC °CS>VBAC °Desires TOLAC, signed 07/05/2021 ° °5. Syphilis affecting pregnancy in first trimester °S/p treatment with downtrending RPR titer ° °6. Marginal insertion of umbilical cord affecting management of mother °Following w MFM ° °7. Carrier of spinal muscular atrophy °Has partner testing kit ° °Preterm labor symptoms and general obstetric precautions including but not limited to vaginal bleeding, contractions, leaking of fluid and fetal movement were reviewed in detail with the patient. °Please refer to After Visit Summary for other counseling recommendations.  °Return in 2 weeks (on 08/02/2021). ° ° °Eckstat, Matthew M, MD ° °

## 2021-07-19 NOTE — Patient Instructions (Signed)

## 2021-07-20 ENCOUNTER — Ambulatory Visit: Payer: Medicaid Other

## 2021-07-20 ENCOUNTER — Other Ambulatory Visit: Payer: Self-pay | Admitting: *Deleted

## 2021-07-20 DIAGNOSIS — O24419 Gestational diabetes mellitus in pregnancy, unspecified control: Secondary | ICD-10-CM

## 2021-07-25 ENCOUNTER — Other Ambulatory Visit: Payer: Self-pay

## 2021-07-28 ENCOUNTER — Encounter: Payer: Self-pay | Admitting: Family Medicine

## 2021-07-29 ENCOUNTER — Other Ambulatory Visit: Payer: Self-pay

## 2021-07-29 ENCOUNTER — Emergency Department (HOSPITAL_COMMUNITY)
Admission: EM | Admit: 2021-07-29 | Discharge: 2021-07-29 | Disposition: A | Discharge status: Home / Self Care | Payer: Medicaid Other | Attending: Emergency Medicine | Admitting: Emergency Medicine

## 2021-07-29 DIAGNOSIS — O208 Other hemorrhage in early pregnancy: Secondary | ICD-10-CM | POA: Diagnosis present

## 2021-07-29 DIAGNOSIS — N938 Other specified abnormal uterine and vaginal bleeding: Secondary | ICD-10-CM | POA: Diagnosis not present

## 2021-07-29 DIAGNOSIS — Z3A30 30 weeks gestation of pregnancy: Secondary | ICD-10-CM | POA: Diagnosis not present

## 2021-07-29 DIAGNOSIS — O4690 Antepartum hemorrhage, unspecified, unspecified trimester: Secondary | ICD-10-CM

## 2021-07-29 DIAGNOSIS — O4693 Antepartum hemorrhage, unspecified, third trimester: Secondary | ICD-10-CM | POA: Diagnosis present

## 2021-07-29 NOTE — Progress Notes (Signed)
Patient ID: Sherri Flores, female   DOB: 03-13-1991, 31 y.o.   MRN: [id number] Called patient and she had to go home and get her kids situated. She will return for admission as directed.

## 2021-07-29 NOTE — ED Triage Notes (Addendum)
Patient reports she woke up with vaginal bleeding. [redacted] wks pregnant. Says she had ultrasound last week and was told a pocket of blood is behind her placenta. Denies pain in triage. Patient reports she is able to feel baby moving in triage.

## 2021-07-29 NOTE — ED Notes (Signed)
Pt left in personal vehicle, refused [facility] transfer. Pt instructed to go to [facility] MAU, pt set to be admitted.

## 2021-07-29 NOTE — ED Provider Notes (Signed)
[facility] DEPT Provider Note   CSN: [id number] Arrival date & time: 07/29/21  0704     History  Chief Complaint  Patient presents with   Vaginal Bleeding    Sherri Flores is a 31 y.o. female.  Pt is a 31 y/o [id number] at 30 weeks who is presenting today due to vaginal bleeding.  Sherri Flores reported that yesterday morning Sherri Flores had a little bit of dark red/brownish spotting in her pad.  Sherri Flores denied any abdominal pain and otherwise felt fine throughout the day.  Sherri Flores did try to contact her doctor but did not get any response.  Sherri Flores reports this morning Sherri Flores woke up and the pad was completely soaked with dark bloody looking substance.  This scared her.  Sherri Flores reported yesterday throughout the day when Sherri Flores went to the bathroom and wiped there was no evidence of blood.  This morning Sherri Flores has had a little bit of ongoing bleeding on the pad but no brisk bleeding or large clots.  Sherri Flores is still having no abdominal discomfort and is feeling baby move.  Sherri Flores did have an ultrasound done 4 days ago that showed that Sherri Flores had what was thought to be residual subchorionic hemorrhage but had not had any bleeding until this time.  The last bleeding Sherri Flores had was at 14 weeks.  Sherri Flores denies any abdominal trauma or falls.  Sherri Flores otherwise feels her normal self.  The history is provided by the patient and medical records.  Vaginal Bleeding     Home Medications Prior to Admission medications   Medication Sig Start Date End Date Taking? Authorizing Provider  Accu-Chek Softclix Lancets lancets Use four times daily as instructed. 07/13/21   Clarnce Flock, MD  Blood Glucose Monitoring Suppl (ACCU-CHEK GUIDE) w/Device KIT 1 Device by Does not apply route in the morning, at noon, in the evening, and at bedtime. 07/13/21   Clarnce Flock, MD  glucose blood test strip Use as instructed 07/13/21   Clarnce Flock, MD  prenatal vitamin w/FE, FA (PRENATAL 1 + 1) 27-1 MG TABS tablet Take 1 tablet by mouth  daily at 12 noon. 02/22/21   Clarnce Flock, MD      Allergies    Patient has no known allergies.    Review of Systems   Review of Systems  Genitourinary:  Positive for vaginal bleeding.   Physical Exam Updated Vital Signs BP 122/79    Pulse (!) 49    Temp 97.7 F (36.5 C) (Oral)    Resp 16    Ht _0  (1.549 m)    Wt 59 kg    LMP 12/23/2020 (Approximate)    SpO2 91%    BMI 24.56 kg/m  Physical Exam Vitals and nursing note reviewed.  Constitutional:      General: Sherri Flores is not in acute distress.    Appearance: Normal appearance. Sherri Flores is well-developed.  HENT:     Head: Normocephalic and atraumatic.  Eyes:     Pupils: Pupils are equal, round, and reactive to light.  Cardiovascular:     Rate and Rhythm: Normal rate and regular rhythm.     Heart sounds: Normal heart sounds. No murmur heard.   No friction rub.  Pulmonary:     Effort: Pulmonary effort is normal.     Breath sounds: Normal breath sounds. No wheezing or rales.  Abdominal:     General: Bowel sounds are normal. There is no distension.     Palpations: Abdomen  is soft.     Tenderness: There is no abdominal tenderness. There is no guarding or rebound.     Comments: Gravid above the umbilicus.  Baby is visually noted to be moving.  Fetal heart tones are 134.  No abdominal tenderness  Musculoskeletal:        General: No tenderness. Normal range of motion.     Comments: No edema  Skin:    General: Skin is warm and dry.     Findings: No rash.  Neurological:     Mental Status: Sherri Flores is alert and oriented to person, place, and time.     Cranial Nerves: No cranial nerve deficit.  Psychiatric:        Behavior: Behavior normal.    ED Results / Procedures / Treatments   Labs (all labs ordered are listed, but only abnormal results are displayed) Labs Reviewed - No data to display  EKG None  Radiology No results found.  Procedures Procedures    Medications Ordered in ED Medications - No data to display  ED  Course/ Medical Decision Making/ A&P                           Medical Decision Making  31 year old female currently [redacted] weeks pregnant with a high risk pregnancy with preterm delivery in the past and currently having gestational diabetes who is presenting today with vaginal bleeding.  External medical records from her recent OB appointment on 07/22/2021 was reviewed.  Ultrasound done on 07/19/2021 noted a 5 x 2 x 1 cm hypoechoic retroplacental area that they thought could represent old hematoma.  Patient is otherwise well-appearing today.  No abdominal pain at this time.  Denying any cramps or pain suggestive of preterm labor.  Fetal heart tones are 134.  8:45 AM Spoke with Dr. Kennon Rounds at [facility] and Sherri Flores wants pt admitted.  Findings discussed with the pt and her significant other.  Risk were discussed with the patient.  Pt wants to go by private vehicle to [facility].  [facility] confirmed that was ok.  Pt has directions and is otherwise stable with normal BP and minimal bleeding at this time.        Final Clinical Impression(s) / ED Diagnoses Final diagnoses:  Vaginal bleeding in pregnancy    Rx / DC Orders ED Discharge Orders     None         Blanchie Dessert, MD 07/29/21 [id number]

## 2021-08-02 ENCOUNTER — Other Ambulatory Visit: Payer: Self-pay

## 2021-08-02 ENCOUNTER — Ambulatory Visit (INDEPENDENT_AMBULATORY_CARE_PROVIDER_SITE_OTHER): Payer: Medicaid Other | Admitting: Obstetrics and Gynecology

## 2021-08-02 VITALS — BP 110/71 | HR 80 | Wt 129.3 lb

## 2021-08-02 DIAGNOSIS — O24410 Gestational diabetes mellitus in pregnancy, diet controlled: Secondary | ICD-10-CM

## 2021-08-02 DIAGNOSIS — O0990 Supervision of high risk pregnancy, unspecified, unspecified trimester: Secondary | ICD-10-CM

## 2021-08-02 DIAGNOSIS — O43199 Other malformation of placenta, unspecified trimester: Secondary | ICD-10-CM

## 2021-08-02 DIAGNOSIS — Z148 Genetic carrier of other disease: Secondary | ICD-10-CM

## 2021-08-02 DIAGNOSIS — Z98891 History of uterine scar from previous surgery: Secondary | ICD-10-CM

## 2021-08-02 NOTE — Progress Notes (Signed)
? ?  PRENATAL VISIT NOTE ? ?Subjective:  ?Sherri Flores is a 31 y.o. [id number] at [redacted]w[redacted]d being seen today for ongoing prenatal care.  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 ? ?Patient doing well with no acute concerns today. She reports  a history of vaginal bleeding over the weekend.  She went to [facility] ER, but was not evaluated at MAU .  Contractions: Irritability. Vag. Bleeding: None.  Movement: Present. Denies leaking of fluid.  ? ?The following portions of the patient's history were reviewed and updated as appropriate: allergies, current medications, past family history, past medical history, past social history, past surgical history and problem list. Problem list updated. ? ?Objective:  ? ?Vitals:  ? 08/02/21 1404  ?BP: 110/71  ?Pulse: 80  ?Weight: 129 lb 4.8 oz (58.7 kg)  ? ? ?Fetal Status: Fetal Heart Rate (bpm): 155   Movement: Present    ? ?General:  Alert, oriented and cooperative. Patient is in no acute distress.  ?Skin: Skin is warm and dry. No rash noted.   ?Cardiovascular: Normal heart rate noted  ?Respiratory: Normal respiratory effort, no problems with respiration noted  ?Abdomen: Soft, gravid, appropriate for gestational age.  Pain/Pressure: Present     ?Pelvic: Cervical exam deferred        ?Extremities: Normal range of motion.  Edema: None  ?Mental Status:  Normal mood and affect. Normal behavior. Normal judgment and thought content.  ? ?Assessment and Plan:  ?Pregnancy: [id number] at [redacted]w[redacted]d ? ?1. Diet controlled gestational diabetes mellitus (GDM) in third trimester ?FBS: 72-105 ?PPBS:  69-122 ?Discussed in detail better food choices, pt advised to decrease high carb intake ? ?2. Supervision of high risk pregnancy, antepartum ?Continue routine care ? ?3. History of VBAC ?TOLAC consent previously signed ? ?4. Carrier of spinal muscular atrophy ? ? ?5. Marginal insertion of umbilical cord affecting management of mother ?Pt has [place] follow up on 08/15/21 ? ?Preterm labor symptoms and general obstetric precautions including but not limited to vaginal bleeding, contractions, leaking of fluid and fetal movement were reviewed in detail with the patient. ? ?Please refer to After Visit Summary for other counseling recommendations.  ? ?Return in about 2 weeks (around 08/16/2021) for [facility], in person. ? ? ?Lynnda Shields, MD ?Faculty Attending ?[facility] ?  ?

## 2021-08-05 ENCOUNTER — Inpatient Hospital Stay (HOSPITAL_COMMUNITY)
Admission: AD | Admit: 2021-08-05 | Discharge: 2021-08-09 | DRG: 807 | Disposition: A | Discharge status: Home / Self Care | Payer: Medicaid Other | Attending: Obstetrics and Gynecology | Admitting: Obstetrics and Gynecology

## 2021-08-05 DIAGNOSIS — O24410 Gestational diabetes mellitus in pregnancy, diet controlled: Secondary | ICD-10-CM

## 2021-08-05 DIAGNOSIS — O43199 Other malformation of placenta, unspecified trimester: Secondary | ICD-10-CM

## 2021-08-05 DIAGNOSIS — O42919 Preterm premature rupture of membranes, unspecified as to length of time between rupture and onset of labor, unspecified trimester: Secondary | ICD-10-CM

## 2021-08-05 DIAGNOSIS — O43123 Velamentous insertion of umbilical cord, third trimester: Secondary | ICD-10-CM | POA: Diagnosis present

## 2021-08-05 DIAGNOSIS — Z20822 Contact with and (suspected) exposure to covid-19: Secondary | ICD-10-CM | POA: Diagnosis present

## 2021-08-05 DIAGNOSIS — Z87891 Personal history of nicotine dependence: Secondary | ICD-10-CM

## 2021-08-05 DIAGNOSIS — O42913 Preterm premature rupture of membranes, unspecified as to length of time between rupture and onset of labor, third trimester: Principal | ICD-10-CM | POA: Diagnosis present

## 2021-08-05 DIAGNOSIS — O4290 Premature rupture of membranes, unspecified as to length of time between rupture and onset of labor, unspecified weeks of gestation: Secondary | ICD-10-CM

## 2021-08-05 DIAGNOSIS — Z148 Genetic carrier of other disease: Secondary | ICD-10-CM

## 2021-08-05 DIAGNOSIS — O24420 Gestational diabetes mellitus in childbirth, diet controlled: Secondary | ICD-10-CM | POA: Diagnosis present

## 2021-08-05 DIAGNOSIS — O4693 Antepartum hemorrhage, unspecified, third trimester: Secondary | ICD-10-CM

## 2021-08-05 DIAGNOSIS — O3433 Maternal care for cervical incompetence, third trimester: Secondary | ICD-10-CM

## 2021-08-05 DIAGNOSIS — Z3A32 32 weeks gestation of pregnancy: Secondary | ICD-10-CM

## 2021-08-05 DIAGNOSIS — O34219 Maternal care for unspecified type scar from previous cesarean delivery: Secondary | ICD-10-CM

## 2021-08-05 DIAGNOSIS — O43893 Other placental disorders, third trimester: Secondary | ICD-10-CM | POA: Diagnosis present

## 2021-08-06 ENCOUNTER — Encounter (HOSPITAL_COMMUNITY): Payer: Self-pay | Admitting: Obstetrics and Gynecology

## 2021-08-06 ENCOUNTER — Other Ambulatory Visit: Payer: Self-pay

## 2021-08-06 DIAGNOSIS — O43123 Velamentous insertion of umbilical cord, third trimester: Secondary | ICD-10-CM | POA: Diagnosis present

## 2021-08-06 DIAGNOSIS — Z20822 Contact with and (suspected) exposure to covid-19: Secondary | ICD-10-CM | POA: Diagnosis present

## 2021-08-06 DIAGNOSIS — O24420 Gestational diabetes mellitus in childbirth, diet controlled: Secondary | ICD-10-CM | POA: Diagnosis present

## 2021-08-06 DIAGNOSIS — Z148 Genetic carrier of other disease: Secondary | ICD-10-CM | POA: Diagnosis not present

## 2021-08-06 DIAGNOSIS — O4693 Antepartum hemorrhage, unspecified, third trimester: Secondary | ICD-10-CM | POA: Diagnosis not present

## 2021-08-06 DIAGNOSIS — O42913 Preterm premature rupture of membranes, unspecified as to length of time between rupture and onset of labor, third trimester: Secondary | ICD-10-CM | POA: Diagnosis present

## 2021-08-06 DIAGNOSIS — O99824 Streptococcus B carrier state complicating childbirth: Secondary | ICD-10-CM | POA: Diagnosis not present

## 2021-08-06 DIAGNOSIS — O4290 Premature rupture of membranes, unspecified as to length of time between rupture and onset of labor, unspecified weeks of gestation: Secondary | ICD-10-CM | POA: Diagnosis present

## 2021-08-06 DIAGNOSIS — O34219 Maternal care for unspecified type scar from previous cesarean delivery: Secondary | ICD-10-CM | POA: Diagnosis present

## 2021-08-06 DIAGNOSIS — O43893 Other placental disorders, third trimester: Secondary | ICD-10-CM | POA: Diagnosis present

## 2021-08-06 DIAGNOSIS — O4210 Premature rupture of membranes, onset of labor more than 24 hours following rupture, unspecified weeks of gestation: Secondary | ICD-10-CM

## 2021-08-06 DIAGNOSIS — Z3A32 32 weeks gestation of pregnancy: Secondary | ICD-10-CM | POA: Diagnosis not present

## 2021-08-06 DIAGNOSIS — Z87891 Personal history of nicotine dependence: Secondary | ICD-10-CM | POA: Diagnosis not present

## 2021-08-06 DIAGNOSIS — O34211 Maternal care for low transverse scar from previous cesarean delivery: Secondary | ICD-10-CM | POA: Diagnosis not present

## 2021-08-06 LAB — POCT FERN TEST: POCT Fern Test: POSITIVE

## 2021-08-06 LAB — WET PREP, GENITAL
Clue Cells Wet Prep HPF POC: NONE SEEN
Sperm: NONE SEEN
Trich, Wet Prep: NONE SEEN
WBC, Wet Prep HPF POC: <10 (ref <10)
Yeast Wet Prep HPF POC: NONE SEEN

## 2021-08-06 LAB — OB RESULTS CONSOLE GBS: GBS: POSITIVE

## 2021-08-06 LAB — OB RESULTS CONSOLE GC/CHLAMYDIA: Gonorrhea: NEGATIVE

## 2021-08-06 LAB — GLUCOSE, CAPILLARY
Glucose-Capillary: 112 mg/dL — ABNORMAL HIGH (ref 70–99)
Glucose-Capillary: 120 mg/dL — ABNORMAL HIGH (ref 70–99)
Glucose-Capillary: 86 mg/dL (ref 70–99)

## 2021-08-06 LAB — RESP PANEL BY RT-PCR (FLU A&B, COVID) ARPGX2
Influenza A by PCR: NEGATIVE
Influenza B by PCR: NEGATIVE
SARS Coronavirus 2 by RT PCR: NEGATIVE

## 2021-08-06 MED ORDER — MAGNESIUM SULFATE BOLUS VIA INFUSION
4.0000 g | Freq: Once | INTRAVENOUS | Status: AC
  Administered 2021-08-06: 4 g via INTRAVENOUS
  Filled 2021-08-06: qty 1000

## 2021-08-06 MED ORDER — ACETAMINOPHEN 325 MG PO TABS
650.0000 mg | ORAL_TABLET | ORAL | Status: DC | PRN
  Administered 2021-08-06 – 2021-08-07 (×5): 650 mg via ORAL
  Filled 2021-08-06 (×6): qty 2

## 2021-08-06 MED ORDER — PRENATAL MULTIVITAMIN CH
1.0000 | ORAL_TABLET | Freq: Every day | ORAL | Status: DC
  Administered 2021-08-06 – 2021-08-07 (×2): 1 via ORAL
  Filled 2021-08-06 (×2): qty 1

## 2021-08-06 MED ORDER — LACTATED RINGERS IV BOLUS
1000.0000 mL | Freq: Once | INTRAVENOUS | Status: AC
  Administered 2021-08-06: 999 mL via INTRAVENOUS

## 2021-08-06 MED ORDER — AZITHROMYCIN 250 MG PO TABS
1000.0000 mg | ORAL_TABLET | Freq: Once | ORAL | Status: AC
  Administered 2021-08-06: 1000 mg via ORAL
  Filled 2021-08-06: qty 4

## 2021-08-06 MED ORDER — INSULIN ASPART 100 UNIT/ML IJ SOLN: 0.0000 [IU] | Freq: Three times a day (TID) | INTRAMUSCULAR | Status: DC

## 2021-08-06 MED ORDER — ZOLPIDEM TARTRATE 5 MG PO TABS: 5.0000 mg | ORAL_TABLET | Freq: Every evening | ORAL | Status: DC | PRN

## 2021-08-06 MED ORDER — MAGNESIUM SULFATE 40 GM/1000ML IV SOLN
2.0000 g/h | INTRAVENOUS | Status: AC
  Administered 2021-08-06: 2 g/h via INTRAVENOUS

## 2021-08-06 MED ORDER — BETAMETHASONE SOD PHOS & ACET 6 (3-3) MG/ML IJ SUSP
12.0000 mg | Freq: Once | INTRAMUSCULAR | Status: DC
  Filled 2021-08-06: qty 5

## 2021-08-06 MED ORDER — LACTATED RINGERS IV SOLN: INTRAVENOUS | Status: DC

## 2021-08-06 MED ORDER — SODIUM CHLORIDE 0.9 % IV SOLN
2.0000 g | Freq: Four times a day (QID) | INTRAVENOUS | Status: AC
  Administered 2021-08-06 – 2021-08-07 (×8): 2 g via INTRAVENOUS
  Filled 2021-08-06 (×8): qty 2000

## 2021-08-06 MED ORDER — CALCIUM CARBONATE ANTACID 500 MG PO CHEW: 2.0000 | CHEWABLE_TABLET | ORAL | Status: DC | PRN

## 2021-08-06 MED ORDER — MAGNESIUM SULFATE 40 GM/1000ML IV SOLN
INTRAVENOUS | Status: AC
  Filled 2021-08-06: qty 1000

## 2021-08-06 MED ORDER — FENTANYL CITRATE (PF) 100 MCG/2ML IJ SOLN
50.0000 ug | Freq: Once | INTRAMUSCULAR | Status: AC
  Administered 2021-08-08: 50 ug via INTRAVENOUS
  Filled 2021-08-06: qty 2

## 2021-08-06 MED ORDER — TERBUTALINE SULFATE 1 MG/ML IJ SOLN
0.2500 mg | Freq: Once | INTRAMUSCULAR | Status: AC
  Administered 2021-08-06: 0.25 mg via SUBCUTANEOUS
  Filled 2021-08-06: qty 1

## 2021-08-06 MED ORDER — DOCUSATE SODIUM 100 MG PO CAPS
100.0000 mg | ORAL_CAPSULE | Freq: Every day | ORAL | Status: DC
  Filled 2021-08-06: qty 1

## 2021-08-06 MED ORDER — BETAMETHASONE SOD PHOS & ACET 6 (3-3) MG/ML IJ SUSP
12.0000 mg | INTRAMUSCULAR | Status: AC
  Administered 2021-08-06 – 2021-08-07 (×2): 12 mg via INTRAMUSCULAR
  Filled 2021-08-06: qty 5

## 2021-08-06 MED ORDER — AMOXICILLIN 500 MG PO CAPS
500.0000 mg | ORAL_CAPSULE | Freq: Three times a day (TID) | ORAL | Status: DC
  Administered 2021-08-08: 500 mg via ORAL
  Filled 2021-08-06: qty 1

## 2021-08-06 NOTE — Progress Notes (Addendum)
FACULTY PRACTICE ANTEPARTUM(COMPREHENSIVE) NOTE ? ?Sherri Flores is a 31 y.o. [id number] with Estimated Date of Delivery: 09/29/21   By  LMP [redacted]w[redacted]d  who is admitted for PPROM.   ? ?Fetal presentation is cephalic. ?Length of Stay:  0  Days  Date of admission:08/05/2021 ? ?Subjective: ?Pt notes still leaking blood-watered fluid- amount of blood has decreased since arrival. ?Patient reports the fetal movement as active. ?Patient reports uterine contraction  activity as regular, every 3-7 minutes.  Not as strong as on arrival. ? ? ?Vitals:  Blood pressure (!) 107/59, pulse 77, temperature 97.6 ?F (36.4 ?C), temperature source Oral, resp. rate 17, height 5\' 1"  (1.549 m), weight 55.3 kg, last menstrual period 12/23/2020, SpO2 95 %. ?Vitals:  ? 08/06/21 0714 08/06/21 0810 08/06/21 0853 08/06/21 1000  ?BP:   111/64 (!) 107/59  ?Pulse:   79 77  ?Resp: 17 16 17    ?Temp:   97.6 ?F (36.4 ?C)   ?TempSrc:   Oral   ?SpO2:   95%   ?Weight:      ?Height:      ? ?Physical Examination: ? General appearance - alert and well appearing, mild distress- notes feeling overwhelmed with the situation ?Mental status - affect appropriate to mood ?Heart - normal rate and regular rhythm ?Abdomen - gravid, soft and non-tender ?Extremities - no edema, no calf tenderness bilaterally ?Skin - warm and dry ? ?Fetal Monitoring:  Baseline: 130 bpm, Variability: moderate variablility, Accelerations: +15x15 accels present, and Decelerations: Absent   reactive ? ?Toco: difficulty tracing contractions, per patient reporting ~ every 10/06/21. ? ?Labs:  ?Results for orders placed or performed during the hospital encounter of 08/05/21 (from the past 24 hour(s))  ?Wet prep, genital  ? Collection Time: 08/06/21 12:09 AM  ? Specimen: Vaginal/Rectal  ?Result Value Ref Range  ? Yeast Wet Prep HPF POC NONE SEEN NONE SEEN  ? Trich, Wet Prep NONE SEEN NONE SEEN  ? Clue Cells Wet Prep HPF POC NONE SEEN NONE SEEN  ? WBC, Wet Prep HPF POC <10 <10  ? Sperm NONE SEEN   ?POCT fern  test  ? Collection Time: 08/06/21 12:13 AM  ?Result Value Ref Range  ? POCT Fern Test Positive = ruptured amniotic membanes   ?Glucose, capillary  ? Collection Time: 08/06/21 12:41 AM  ?Result Value Ref Range  ? Glucose-Capillary 86 70 - 99 mg/dL  ?Resp Panel by RT-PCR (Flu A&B, Covid) Nasopharyngeal Swab  ? Collection Time: 08/06/21  1:23 AM  ? Specimen: Nasopharyngeal Swab; Nasopharyngeal(NP) swabs in vial transport medium  ?Result Value Ref Range  ? SARS Coronavirus 2 by RT PCR NEGATIVE NEGATIVE  ? Influenza A by PCR NEGATIVE NEGATIVE  ? Influenza B by PCR NEGATIVE NEGATIVE  ?Glucose, capillary  ? Collection Time: 08/06/21  6:11 AM  ?Result Value Ref Range  ? Glucose-Capillary 112 (H) 70 - 99 mg/dL  ? ? ?Medications:  Scheduled ? [START ON 08/08/2021] amoxicillin  500 mg Oral TID  ? betamethasone acetate-betamethasone sodium phosphate  12 mg Intramuscular Q24H  ? docusate sodium  100 mg Oral Daily  ? fentaNYL (SUBLIMAZE) injection  50 mcg Intravenous Once  ? insulin aspart  0-20 Units Subcutaneous TID WC  ? magnesium sulfate 40 grams in SWI 1000 mL      ? prenatal multivitamin  1 tablet Oral Q1200  ? ?I have reviewed the patient's current medications. ? ?ASSESSMENT: ?10/06/21 [redacted]w[redacted]d Estimated Date of Delivery: 09/29/21  ? ?-PPROM ?-Vaginal bleeding in third trimester ?-  GDMA1 ?-Syphilis ?-Marginal cord insertion ?-Prior C-section and history of VBAC x 1 ? ?PLAN: ?1) PPROM ?-currently on latency antibiotic ?-Mag x 12hrs- to be discontinued around 4pm this afternoon ?-will continue to monitor contractions pattern ? ?2)FWB- NICHD Cat. I ?-due to contractions, currently on continuous monitoring ?-BMZ # 1 given 3/5 at midnight ?-plan for [place] this week ? ?3) GDMA1 ?-currently diet controlled, SSI ordered due to BMZ ? ?4) Syphilis ?-s/p treatment with downtrending RPR titer, last titer 1:32 (07/11/21) ? ?5) Maternal care ?-PNV daily ?-carb modified diet ?-activity as tolerated ?-desires TOLAC ? ?DISPO: Continue hospitalization  with care as outlined above.  Desires TOLAC with prior successful VBAC x 1.  Will plan for delivery at 34wks or sooner for maternal or fetal indication. ? ?Sherri Flores ?08/06/2021,10:47 AM ? ? ? ?  ?

## 2021-08-06 NOTE — Progress Notes (Signed)
Unit glucometer not registering patients. CGBs entered into flow-sheet under "CGM Device." ?

## 2021-08-06 NOTE — H&P (Signed)
FACULTY PRACTICE ANTEPARTUM ADMISSION HISTORY AND PHYSICAL NOTE ? ? ?History of Present Illness: ?Sherri Flores is a 31 y.o. [id number] at 23w2dadmitted for preterm rupture of membranes.  Reports feeling wet all day. This evening she started feeling painful contractions. Upon arrival to the hospital she felt a gush of fluid and saw a large amount of bloody water in the toilet.  ? ?Patient reports the fetal movement as active. ?Patient reports uterine contraction  activity as regular, every 3-5 minutes. ?Patient reports  vaginal bleeding as  bloody water . ?Patient describes fluid per vagina as Other bloody. ?Fetal presentation is cephalic. ? ?Patient Active Problem List  ? Diagnosis Date Noted  ? Vaginal bleeding in pregnancy, third trimester 07/29/2021  ? GDM (gestational diabetes mellitus) 07/13/2021  ? Marginal insertion of umbilical cord affecting management of mother 06/06/2021  ? Carrier of spinal muscular atrophy 03/31/2021  ? 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 History of VBAC 03/15/2021  ? Preterm delivery, delivered 02/22/2021  ? Supervision of high risk pregnancy, antepartum 02/22/2021  ? ? ?Past Medical History:  ?Diagnosis Date  ? Anemia   ? Chlamydia 2013  ? Ectopic pregnancy   ? Trichomoniasis 2013  ? ? ?Past Surgical History:  ?Procedure Laterality Date  ? CESAREAN SECTION    ? SALPINGECTOMY Left   ? In the setting of ruptured ectopic  ? ? ?OB History  ?Gravida Para Term Preterm AB Living  ?'4 2 1 1 1 2  ' ?SAB IAB Ectopic Multiple Live Births  ?    1   2  ?  ?# Outcome Date GA Lbr Len/2nd Weight Sex Delivery Anes PTL Lv  ?4 Current           ?3 Ectopic 05/2017          ?2 Term 04/03/14 367w0d F Vag-Spont   LIV  ?1 Preterm 03/22/09 [id number] CS-Unspec  Y LIV  ? ? ?Social History  ? ?Socioeconomic History  ? Marital status: Single  ?  Spouse name: Not on file  ? Number of children: Not on file  ? Years of education: Not on file  ? Highest education level: Not on file   ?Occupational History  ? Not on file  ?Tobacco Use  ? Smoking status: Former  ?  Packs/day: 0.25  ?  Types: Cigarettes  ?  Quit date: 04/06/2021  ?  Years since quitting: 0.3  ? Smokeless tobacco: Never  ?Vaping Use  ? Vaping Use: Never used  ?Substance and Sexual Activity  ? Alcohol use: Not Currently  ?  Comment: not while preg  ? Drug use: Never  ? Sexual activity: Yes  ?  Birth control/protection: None  ?Other Topics Concern  ? Not on file  ?Social History Narrative  ? Not on file  ? ?Social Determinants of Health  ? ?Financial Resource Strain: Not on file  ?Food Insecurity: Food Insecurity Present  ? Worried About [employer] fundraiser the Last Year: Sometimes true  ? Ran Out of Food in the Last Year: Sometimes true  ?Transportation Needs: No Transportation Needs  ? Lack of Transportation (Medical): No  ? Lack of Transportation (Non-Medical): No  ?Physical Activity: Not on file  ?Stress: Not on file  ?Social Connections: Not on file  ? ? ?Family History  ?Problem Relation Age of Onset  ? Hypertension Maternal Grandmother   ? ? ?No Known Allergies ? ?Medications Prior to Admission  ?  Medication Sig Dispense Refill Last Dose  ? Accu-Chek Softclix Lancets lancets Use four times daily as instructed. 100 each 12   ? Blood Glucose Monitoring Suppl (ACCU-CHEK GUIDE) w/Device KIT 1 Device by Does not apply route in the morning, at noon, in the evening, and at bedtime. 1 kit 0   ? glucose blood test strip Use as instructed 100 each 12   ? prenatal vitamin w/FE, FA (PRENATAL 1 + 1) 27-1 MG TABS tablet Take 1 tablet by mouth daily at 12 noon. 30 tablet 11   ? ? ?Review of Systems - Negative except abdominal pain & vaginal bleeding ?History obtained from the patient ? ?Vitals:  LMP 12/23/2020 (Approximate)  ?Physical Examination: ?CONSTITUTIONAL: Well-developed, well-nourished female in no acute distress.  ?HENT:  Normocephalic, atraumatic, External right and left ear normal. Oropharynx is clear and moist ?EYES:  Conjunctivae and EOM are normal. Pupils are equal, round, and reactive to light. No scleral icterus.  ?NECK: Normal range of motion, supple, no masses ?SKIN: Skin is warm and dry. No rash noted. Not diaphoretic. No erythema. No pallor. ?Gratis: Alert and oriented to person, place, and time. Normal reflexes, muscle tone coordination. No cranial nerve deficit noted. ?PSYCHIATRIC: Normal mood and affect. Normal behavior. Normal judgment and thought content. ?CARDIOVASCULAR: Normal heart rate noted, regular rhythm ?RESPIRATORY: Effort and breath sounds normal, no problems with respiration noted ?ABDOMEN: Soft, nontender, nondistended, gravid. ?MUSCULOSKELETAL: Normal range of motion. No edema and no tenderness. 2+ distal pulses. ? ?Cervix: Dilation: 1 ?Effacement (%): 60 ?Cervical Position: Posterior ?Station: 0 ?Presentation: Vertex ?Exam by:: Jorje Guild NP ?Membranes:ruptured, sterile speculum exam moderate amount of bloody fluid ?Fetal Monitoring:Baseline: 140 bpm, Variability: Good {> 6 bpm), Accelerations: 10x10, and Decelerations: Variable: mild ?Tocometer: Needs adjustment - contracting every 3 minutes during her exam ? ?Labs:  ?Results for orders placed or performed during the hospital encounter of 08/05/21 (from the past 24 hour(s))  ?POCT fern test  ? Collection Time: 08/06/21 12:13 AM  ?Result Value Ref Range  ? POCT Fern Test Positive = ruptured amniotic membanes   ? ? ?Imaging Studies: ?No results found. ? ? ?Assessment and Plan: ?1. Preterm premature rupture of membranes (PPROM) with unknown onset of labor  ?-admit to [facility] unit ?-antibiotics ?-GC/CT & GBS culture collected  ?2. Premature cervical dilation in third trimester   ?3. Vaginal bleeding in pregnancy, third trimester   ?4. Diet controlled gestational diabetes mellitus (GDM) in third trimester   ?5. Marginal insertion of umbilical cord affecting management of mother   ?6. [redacted] weeks gestation of pregnancy   ? ? ?Jorje Guild,  NP ?08/06/2021 ?12:23 AM ? ?

## 2021-08-06 NOTE — Consult Note (Signed)
Neonatology Consult  Note: ? ?At the request of the patients obstetrician Dr. Nelda Marseille I met with Vincenza Hews who is a 31 y.o. [phone] at 48w2dadmitted for preterm rupture of membranes with pregnancy complicated by PPROM, vaginal bleeding, premature cervical dilation, GDM - diet controlled, marginal cord insertion, SMA carrier, history of syphilis, history of previous preterm delivery. ? ?We reviewed initial delivery room management, including CPAP, [place], and low but certainly possible need for intubation for surfactant administration.  We discussed feeding immaturity and need for full po intake with multiple days of good weight gain and no apnea or bradycardia before discharge.  We reviewed increased risk of jaundice, infection, and temperature instability.   ?Discussed likely length of stay. ? ?Thank you for allowing [facility] participate in her care.  Please call with questions. ? ?BHiginio Roger DO  ?Neonatologist ? ?The total length of face-to-face or floor / unit time for this encounter was 35 minutes.  Counseling and / or coordination of care was greater than fifty percent of the time. ? ? ? ?

## 2021-08-06 NOTE — MAU Note (Signed)
Patient Access called about patient leaking fluid. RN brought patient from the lobby to a room. Pt reported she was ctx since 2200 every 3-4 mins, and felt more wet in her underwear than usual. When patient was in the bathroom lobby, a gush of red watery fluid leaked on the floor.. Pt denies DFM, abnormal discharge, and PIH s/s. RN placed Patient on the EFM at 2350 and obtain MAU provider to evaluate patient. Pt reported having a resolved subchronic hemorrhage, GDM, and previous C/S with successful VBAC.  ?

## 2021-08-07 DIAGNOSIS — O42913 Preterm premature rupture of membranes, unspecified as to length of time between rupture and onset of labor, third trimester: Principal | ICD-10-CM

## 2021-08-07 LAB — CBC
HCT: 32 % — ABNORMAL LOW (ref 36.0–46.0)
Hemoglobin: 10.8 g/dL — ABNORMAL LOW (ref 12.0–15.0)
MCH: 32 pg (ref 26.0–34.0)
MCHC: 33.8 g/dL (ref 30.0–36.0)
MCV: 95 fL (ref 80.0–100.0)
Platelets: 148 10*3/uL — ABNORMAL LOW (ref 150–400)
RBC: 3.37 MIL/uL — ABNORMAL LOW (ref 3.87–5.11)
RDW: 14 % (ref 11.5–15.5)
WBC: 16.6 10*3/uL — ABNORMAL HIGH (ref 4.0–10.5)
nRBC: 0 % (ref 0.0–0.2)

## 2021-08-07 LAB — GC/CHLAMYDIA PROBE AMP (~~LOC~~) NOT AT ARMC
Chlamydia: NEGATIVE
Comment: NEGATIVE
Comment: NORMAL
Neisseria Gonorrhea: NEGATIVE

## 2021-08-07 LAB — TYPE AND SCREEN
ABO/RH(D): O POS
Antibody Screen: NEGATIVE

## 2021-08-07 LAB — GLUCOSE, CAPILLARY
Glucose-Capillary: 118 mg/dL — ABNORMAL HIGH (ref 70–99)
Glucose-Capillary: 109 mg/dL — ABNORMAL HIGH (ref 70–99)
Glucose-Capillary: 105 mg/dL — ABNORMAL HIGH (ref 70–99)
Glucose-Capillary: 106 mg/dL — ABNORMAL HIGH (ref 70–99)
Glucose-Capillary: 100 mg/dL — ABNORMAL HIGH (ref 70–99)
Glucose-Capillary: 128 mg/dL — ABNORMAL HIGH (ref 70–99)
Glucose-Capillary: 117 mg/dL — ABNORMAL HIGH (ref 70–99)
Glucose-Capillary: 100 mg/dL — ABNORMAL HIGH (ref 70–99)
Glucose-Capillary: 104 mg/dL — ABNORMAL HIGH (ref 70–99)

## 2021-08-07 LAB — CULTURE, BETA STREP (GROUP B ONLY): Special Requests: NORMAL

## 2021-08-07 LAB — RPR
RPR Ser Ql: REACTIVE — AB
RPR Titer: 1:64 {titer}

## 2021-08-07 MED ORDER — DOCUSATE SODIUM 100 MG PO CAPS: 100.0000 mg | ORAL_CAPSULE | Freq: Two times a day (BID) | ORAL | Status: DC | PRN

## 2021-08-07 MED ORDER — TERBUTALINE SULFATE 1 MG/ML IJ SOLN
0.2500 mg | Freq: Once | INTRAMUSCULAR | Status: AC
  Administered 2021-08-07: 0.25 mg via SUBCUTANEOUS
  Filled 2021-08-07: qty 1

## 2021-08-07 NOTE — Progress Notes (Addendum)
Daily Antepartum Note  ?Admission Date: 08/05/2021 ?Current Date: 08/07/2021 ?9:42 AM ? ?Sherri Flores is a 31 y.o. [id number] @ [redacted]w[redacted]d, HD#2, admitted for PPROM at 32/1. ? ?Pregnancy complicated by: ?Patient Active Problem List  ? Diagnosis Date Noted  ? PROM (premature rupture of membranes) 08/06/2021  ? Vaginal bleeding in pregnancy, third trimester 07/29/2021  ? GDM (gestational diabetes mellitus) 07/13/2021  ? Marginal insertion of umbilical cord affecting management of mother 06/06/2021  ? Carrier of spinal muscular atrophy 03/31/2021  ? 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 History of VBAC 03/15/2021  ? Preterm delivery, delivered 02/22/2021  ? Supervision of high risk pregnancy, antepartum 02/22/2021  ? ? ?Overnight/24hr events:  ?Got a dose of terbutaline ? ?Subjective:  ?No more VB. Still with LOF (clear). No UCs felt. +FM ? ?Objective:  ? ? Current Vital Signs 24h Vital Sign Ranges  ?T 98.6 ?F (37 ?C) Temp  Avg: 98 ?F (36.7 ?C)  Min: 97.6 ?F (36.4 ?C)  Max: 98.6 ?F (37 ?C)  ?BP 117/64 BP  Min: 107/63  Max: 117/64  ?HR 78 Pulse  Avg: 79.3  Min: 75  Max: 89  ?RR 16 Resp  Avg: 16.6  Min: 16  Max: 18  ?SaO2 98 % Room Air SpO2  Avg: 98 %  Min: 98 %  Max: 98 %  ?    ? 24 Hour I/O Current Shift I/O  ?Time ?Ins ?Outs 03/05 0701 - 03/06 0700 ?In: 3763.3 [P.O.:2160; I.V.:1203.3] ?Out: 5400 [Urine:5400] 03/06 0701 - 03/06 1900 ?In: -  ?Out: 300 [Urine:300]  ? ?Fetal Heart Tones: 130 baseline, +accels, no decel, mod variability ?Tocometry: q37m ? ?Physical exam: ?General: Well nourished, well developed female in no acute distress. ?Abdomen: gravid nttp ?Cardiovascular: S1, S2 normal, no murmur, rub or gallop, regular rate and rhythm ?Respiratory: CTAB ?Extremities: no clubbing, cyanosis or edema ?Skin: Warm and dry.  ? ?Medications: ?Current Facility-Administered Medications  ?Medication Dose Route Frequency Provider Last Rate Last Admin  ? acetaminophen (TYLENOL) tablet 650 mg  650 mg Oral Q4H PRN  Hermina Staggers, MD   650 mg at 08/07/21 9169  ? ampicillin (OMNIPEN) 2 g in sodium chloride 0.9 % 100 mL IVPB  2 g Intravenous Q6H Hermina Staggers, MD 300 mL/hr at 08/07/21 0932 2 g at 08/07/21 0932  ? Followed by  ? [START ON 08/08/2021] amoxicillin (AMOXIL) capsule 500 mg  500 mg Oral TID Hermina Staggers, MD      ? calcium carbonate (TUMS - dosed in mg elemental calcium) chewable tablet 400 mg of elemental calcium  2 tablet Oral Q4H PRN Hermina Staggers, MD      ? docusate sodium (COLACE) capsule 100 mg  100 mg Oral BID PRN [place] Bing, MD      ? fentaNYL (SUBLIMAZE) injection 50 mcg  50 mcg Intravenous Once Judeth Horn, NP      ? insulin aspart (novoLOG) injection 0-20 Units  0-20 Units Subcutaneous TID WC Hermina Staggers, MD      ? lactated ringers infusion   Intravenous Continuous Hermina Staggers, MD      ? prenatal multivitamin tablet 1 tablet  1 tablet Oral Q1200 Hermina Staggers, MD   1 tablet at 08/07/21 4503  ? ? ?Labs:  ?Recent Labs  ?Lab 08/07/21 ?0842  ?WBC 16.6*  ?HGB 10.8*  ?HCT 32.0*  ?PLT 148*  ? ? ? Latest Reference Range & Units 08/06/21 00:41 08/06/21 06:11 08/06/21 09:21  08/06/21 11:40 08/06/21 13:41 08/06/21 15:58 08/06/21 18:48 08/06/21 21:19 08/07/21 05:26  ?Glucose-Capillary 70 - 99 mg/dL 86 502 (H) 774 (H) 128 (H) 105 (H) 106 (H) 100 (H) 120 (H) 128 (H)  ?(H): Data is abnormally high ? ?Pending: GBS, gc/ct ? ?Radiology:  ?Bedside u/s: cephalic, normal FHR, +FM, AF subjectively low ?2/15: cepahlic, 1375gm, 25%, AC 36%, marginal cord insertion. 5.4 x 2cm hypoechoic area behind the placenta ?hematoma ? ?Assessment & Plan:  ?Pt doing well ?*Pregnancy: reactive NST. Fetal status reassuring. Can switch to qshift NSTs. Admit labs ordered. Thinking about depo provera ?*PPROM: continue latency abx. I d/w her plan is to keep until delivery and delivery 34wks as long as good maternal/fetal status ?*Preterm: s/p bmz on 3/5 and 3/6. S/p NICU consult ?*H/o VBAC: pt desires repeat. Consent  already signed ?*GDMa1: continue with diet control. Rpt growth for 3/8 ordered. Continue am fasting and 2h PP CBGs ?*Syphilis: f/u titer from today. ?07/11/21: 1:32, reactive TPA ?10/31: PCN ?10/24: PCN ?10/17: PCN ?03/15/21: 1:64, reactive TPA ?*PPx: SCDs, OOB ad lib ?*FEN/GI: DM diet, SLIV ?*Dispo: see above.  ? ?Cornelia Copa MD ?Attending ?[facility] [employer] [facility]) ?GYN Consult Phone: [phone] (M-F, [phone]) & [phone]  (Off hours, weekends, holidays) ? ?

## 2021-08-08 ENCOUNTER — Inpatient Hospital Stay (HOSPITAL_COMMUNITY): Payer: Medicaid Other | Admitting: Anesthesiology

## 2021-08-08 ENCOUNTER — Encounter (HOSPITAL_COMMUNITY): Payer: Self-pay | Admitting: Obstetrics and Gynecology

## 2021-08-08 DIAGNOSIS — O42913 Preterm premature rupture of membranes, unspecified as to length of time between rupture and onset of labor, third trimester: Secondary | ICD-10-CM

## 2021-08-08 DIAGNOSIS — O34211 Maternal care for low transverse scar from previous cesarean delivery: Secondary | ICD-10-CM

## 2021-08-08 DIAGNOSIS — O99824 Streptococcus B carrier state complicating childbirth: Secondary | ICD-10-CM

## 2021-08-08 DIAGNOSIS — O24420 Gestational diabetes mellitus in childbirth, diet controlled: Secondary | ICD-10-CM

## 2021-08-08 DIAGNOSIS — Z3A32 32 weeks gestation of pregnancy: Secondary | ICD-10-CM

## 2021-08-08 DIAGNOSIS — O34219 Maternal care for unspecified type scar from previous cesarean delivery: Secondary | ICD-10-CM | POA: Diagnosis not present

## 2021-08-08 LAB — CBC
HCT: 30.7 % — ABNORMAL LOW (ref 36.0–46.0)
Hemoglobin: 10.6 g/dL — ABNORMAL LOW (ref 12.0–15.0)
MCH: 32 pg (ref 26.0–34.0)
MCHC: 34.5 g/dL (ref 30.0–36.0)
MCV: 92.7 fL (ref 80.0–100.0)
Platelets: 141 10*3/uL — ABNORMAL LOW (ref 150–400)
RBC: 3.31 MIL/uL — ABNORMAL LOW (ref 3.87–5.11)
RDW: 13.8 % (ref 11.5–15.5)
WBC: 17.5 10*3/uL — ABNORMAL HIGH (ref 4.0–10.5)
nRBC: 0 % (ref 0.0–0.2)

## 2021-08-08 LAB — GLUCOSE, CAPILLARY
Glucose-Capillary: 84 mg/dL (ref 70–99)
Glucose-Capillary: 71 mg/dL (ref 70–99)
Glucose-Capillary: 85 mg/dL (ref 70–99)

## 2021-08-08 LAB — T.PALLIDUM AB, TOTAL: T Pallidum Abs: REACTIVE — AB

## 2021-08-08 MED ORDER — LACTATED RINGERS IV BOLUS
250.0000 mL | Freq: Once | INTRAVENOUS | Status: AC
  Administered 2021-08-08: 250 mL via INTRAVENOUS

## 2021-08-08 MED ORDER — SODIUM CHLORIDE 0.9 % IV SOLN
2.0000 g | Freq: Once | INTRAVENOUS | Status: AC
  Administered 2021-08-08: 2 g via INTRAVENOUS
  Filled 2021-08-08: qty 2000

## 2021-08-08 MED ORDER — COCONUT OIL OIL
1.0000 | TOPICAL_OIL | Status: DC | PRN
Start: 2021-08-08 — End: 2021-08-09

## 2021-08-08 MED ORDER — LACTATED RINGERS IV SOLN: 500.0000 mL | Freq: Once | INTRAVENOUS | Status: AC

## 2021-08-08 MED ORDER — ZOLPIDEM TARTRATE 5 MG PO TABS: 5.0000 mg | ORAL_TABLET | Freq: Every evening | ORAL | Status: DC | PRN

## 2021-08-08 MED ORDER — DIPHENHYDRAMINE HCL 50 MG/ML IJ SOLN
12.5000 mg | INTRAMUSCULAR | Status: DC | PRN
  Administered 2021-08-08: 12.5 mg via INTRAVENOUS
  Filled 2021-08-08: qty 1

## 2021-08-08 MED ORDER — LIDOCAINE HCL (PF) 1 % IJ SOLN: 30.0000 mL | INTRAMUSCULAR | Status: DC | PRN

## 2021-08-08 MED ORDER — ACETAMINOPHEN 325 MG PO TABS: 650.0000 mg | ORAL_TABLET | ORAL | Status: DC | PRN

## 2021-08-08 MED ORDER — PHENYLEPHRINE 40 MCG/ML (10ML) SYRINGE FOR IV PUSH (FOR BLOOD PRESSURE SUPPORT)
80.0000 ug | PREFILLED_SYRINGE | INTRAVENOUS | Status: DC | PRN
  Filled 2021-08-08: qty 10

## 2021-08-08 MED ORDER — IBUPROFEN 600 MG PO TABS
600.0000 mg | ORAL_TABLET | Freq: Four times a day (QID) | ORAL | Status: DC
  Administered 2021-08-08 – 2021-08-09 (×3): 600 mg via ORAL
  Filled 2021-08-08 (×3): qty 1

## 2021-08-08 MED ORDER — OXYTOCIN BOLUS FROM INFUSION
333.0000 mL | Freq: Once | INTRAVENOUS | Status: AC
  Administered 2021-08-08: 333 mL via INTRAVENOUS

## 2021-08-08 MED ORDER — SOD CITRATE-CITRIC ACID 500-334 MG/5ML PO SOLN: 30.0000 mL | ORAL | Status: DC | PRN

## 2021-08-08 MED ORDER — MEDROXYPROGESTERONE ACETATE 150 MG/ML IM SUSP: 150.0000 mg | INTRAMUSCULAR | Status: DC | PRN

## 2021-08-08 MED ORDER — DIPHENHYDRAMINE HCL 25 MG PO CAPS: 25.0000 mg | ORAL_CAPSULE | Freq: Four times a day (QID) | ORAL | Status: DC | PRN

## 2021-08-08 MED ORDER — LACTATED RINGERS IV SOLN: 500.0000 mL | INTRAVENOUS | Status: DC | PRN

## 2021-08-08 MED ORDER — LACTATED RINGERS IV SOLN: INTRAVENOUS | Status: DC

## 2021-08-08 MED ORDER — EPHEDRINE 5 MG/ML INJ: 10.0000 mg | INTRAVENOUS | Status: DC | PRN

## 2021-08-08 MED ORDER — TETANUS-DIPHTH-ACELL PERTUSSIS 5-2.5-18.5 LF-MCG/0.5 IM SUSY: 0.5000 mL | PREFILLED_SYRINGE | Freq: Once | INTRAMUSCULAR | Status: DC

## 2021-08-08 MED ORDER — DIBUCAINE (PERIANAL) 1 % EX OINT: 1.0000 "application " | TOPICAL_OINTMENT | CUTANEOUS | Status: DC | PRN

## 2021-08-08 MED ORDER — LIDOCAINE HCL (PF) 1 % IJ SOLN
INTRAMUSCULAR | Status: DC | PRN
  Administered 2021-08-08: 11 mL via EPIDURAL

## 2021-08-08 MED ORDER — FENTANYL CITRATE (PF) 100 MCG/2ML IJ SOLN
50.0000 ug | INTRAMUSCULAR | Status: DC | PRN
  Administered 2021-08-08: 100 ug via INTRAVENOUS
  Filled 2021-08-08: qty 2

## 2021-08-08 MED ORDER — FENTANYL-BUPIVACAINE-NACL 0.5-0.125-0.9 MG/250ML-% EP SOLN
12.0000 mL/h | EPIDURAL | Status: DC | PRN
  Administered 2021-08-08: 12 mL/h via EPIDURAL
  Filled 2021-08-08: qty 250

## 2021-08-08 MED ORDER — WITCH HAZEL-GLYCERIN EX PADS: 1.0000 "application " | MEDICATED_PAD | CUTANEOUS | Status: DC | PRN

## 2021-08-08 MED ORDER — ONDANSETRON HCL 4 MG PO TABS: 4.0000 mg | ORAL_TABLET | ORAL | Status: DC | PRN

## 2021-08-08 MED ORDER — FENTANYL CITRATE (PF) 100 MCG/2ML IJ SOLN: 50.0000 ug | Freq: Once | INTRAMUSCULAR | Status: AC

## 2021-08-08 MED ORDER — EPHEDRINE 5 MG/ML INJ
10.0000 mg | INTRAVENOUS | Status: DC | PRN
  Filled 2021-08-08: qty 5

## 2021-08-08 MED ORDER — SENNOSIDES-DOCUSATE SODIUM 8.6-50 MG PO TABS: 2.0000 | ORAL_TABLET | Freq: Every day | ORAL | Status: DC

## 2021-08-08 MED ORDER — PRENATAL MULTIVITAMIN CH: 1.0000 | ORAL_TABLET | Freq: Every day | ORAL | Status: DC

## 2021-08-08 MED ORDER — PHENYLEPHRINE 40 MCG/ML (10ML) SYRINGE FOR IV PUSH (FOR BLOOD PRESSURE SUPPORT): 80.0000 ug | PREFILLED_SYRINGE | INTRAVENOUS | Status: DC | PRN

## 2021-08-08 MED ORDER — BENZOCAINE-MENTHOL 20-0.5 % EX AERO: 1.0000 "application " | INHALATION_SPRAY | CUTANEOUS | Status: DC | PRN

## 2021-08-08 MED ORDER — SODIUM CHLORIDE 0.9 % IV SOLN
1.0000 g | INTRAVENOUS | Status: DC
  Administered 2021-08-08: 1 g via INTRAVENOUS
  Filled 2021-08-08 (×3): qty 1000

## 2021-08-08 MED ORDER — ONDANSETRON HCL 4 MG/2ML IJ SOLN: 4.0000 mg | INTRAMUSCULAR | Status: DC | PRN

## 2021-08-08 MED ORDER — OXYCODONE-ACETAMINOPHEN 5-325 MG PO TABS: 1.0000 | ORAL_TABLET | ORAL | Status: DC | PRN

## 2021-08-08 MED ORDER — LACTATED RINGERS IV BOLUS: 250.0000 mL | Freq: Once | INTRAVENOUS | Status: DC

## 2021-08-08 MED ORDER — OXYTOCIN-SODIUM CHLORIDE 30-0.9 UT/500ML-% IV SOLN
2.5000 [IU]/h | INTRAVENOUS | Status: DC
  Administered 2021-08-08: 2.5 [IU]/h via INTRAVENOUS
  Filled 2021-08-08: qty 500

## 2021-08-08 MED ORDER — ONDANSETRON HCL 4 MG/2ML IJ SOLN: 4.0000 mg | Freq: Four times a day (QID) | INTRAMUSCULAR | Status: DC | PRN

## 2021-08-08 MED ORDER — SIMETHICONE 80 MG PO CHEW: 80.0000 mg | CHEWABLE_TABLET | ORAL | Status: DC | PRN

## 2021-08-08 NOTE — Anesthesia Preprocedure Evaluation (Signed)
Anesthesia Evaluation  ?Patient identified by MRN, date of birth, ID band ?Patient awake ? ? ? ?Reviewed: ?Allergy & Precautions, NPO status , Patient's Chart, lab work & pertinent test results ? ?Airway ?Mallampati: II ? ?TM Distance: >3 FB ?Neck ROM: Full ? ? ? Dental ?no notable dental hx. ? ?  ?Pulmonary ?neg pulmonary ROS, former smoker,  ?  ?Pulmonary exam normal ?breath sounds clear to auscultation ? ? ? ? ? ? Cardiovascular ?negative cardio ROS ?Normal cardiovascular exam ?Rhythm:Regular Rate:Normal ? ? ?  ?Neuro/Psych ?negative neurological ROS ? negative psych ROS  ? GI/Hepatic ?negative GI ROS, Neg liver ROS,   ?Endo/Other  ?diabetes, Gestational ? Renal/GU ?negative Renal ROS  ?negative genitourinary ?  ?Musculoskeletal ?negative musculoskeletal ROS ?(+)  ? Abdominal ?  ?Peds ?negative pediatric ROS ?(+)  Hematology ?negative hematology ROS ?(+)   ?Anesthesia Other Findings ? ? Reproductive/Obstetrics ?(+) Pregnancy ? ?  ? ? ? ? ? ? ? ? ? ? ? ? ? ?  ?  ? ? ? ? ? ? ? ? ?Anesthesia Physical ?Anesthesia Plan ? ?ASA: 2 ? ?Anesthesia Plan: Epidural  ? ?Post-op Pain Management:   ? ?Induction:  ? ?PONV Risk Score and Plan:  ? ?Airway Management Planned:  ? ?Additional Equipment:  ? ?Intra-op Plan:  ? ?Post-operative Plan:  ? ?Informed Consent:  ? ?Plan Discussed with:  ? ?Anesthesia Plan Comments:   ? ? ? ? ? ? ?Anesthesia Quick Evaluation ? ?

## 2021-08-08 NOTE — Lactation Note (Signed)
This note was copied from a baby's chart. ?Lactation Consultation Note ? ?Patient Name: Sherri Flores ?Today's Date: 08/08/2021 ?  ?Age:30 hours ? ?Spoke to L&D RN Lequita Asal. and she confirmed that mom is still planning on formula feeding after NICU admission. Weld services are completed at this time. ? ?Consult Status ?Consult Status: Complete (formula only) ? ? ?Cosette Prindle S Mohsin Crum ?08/08/2021, 1:55 PM ? ? ? ?

## 2021-08-08 NOTE — Progress Notes (Signed)
Labor Progress Note ?Sherri Flores is a 31 y.o. [id number] at [redacted]w[redacted]d presented for IOL ?S: Patient is resting comfortably, feeling better after epidural. ? ?O:  ?BP 117/72   Pulse 72   Temp 97.7 ?F (36.5 ?C) (Oral)   Resp 18   Ht 5\' 1"  (1.549 m)   Wt 55.3 kg   LMP 12/23/2020 (Approximate)   SpO2 100%   BMI 23.04 kg/m?  ?EFM: 130 baseline/moderate varaibility/+accels, variable decelerations present  ? ?CVE: Dilation: 7 ?Dilation Complete Date: 08/08/21 ?Dilation Complete Time: 0713 ?Effacement (%): 90 ?Cervical Position: Posterior ?Station: 0 ?Presentation: Vertex ?Exam by:: Bhmabri, CNM ? ? ?A&P: 31 y.o. 37 [redacted]w[redacted]d PPROM PTL TOLAC s/p BMZ (NICU consult) ?#Labor: Progressing well, expectantly managing ?#Pain: Epidural in place ?#FWB: Cat II, variable decelerations present, will monitor closely ?#GBS pos - ampicillin  ? ?#A1GDM ?84 and 71 most recently. ?CBGs q4h ? ?#Syphilis  ?Titer was 1:64 reactive yesterday from 1:32 07/11/21 ?-S/p 3 doses PCN last year ? ?09/08/21, MD ?[facility], [facility] ?10:23 AM  ?

## 2021-08-08 NOTE — Progress Notes (Signed)
S: After monitoring the patient's strip, her contractions were consistently q 2 minutes and she was becoming more uncomfortable.  She was assessed and her cervix was checked for dilation.  During the exam a moderate amount of dark bloody fluid was noted; however, pt had a known hx of a large retroplacental hematoma. ? ?O: SVE: 2-3/70/-2 ?     FHT: 140-150's moderate variability, accels noted ? ?A/P 32.4 weeks PPROM, preterm labor ?Pt is s/p BMZ and has been on latency antibiotics. ?GBS noted to be positive so will continue ampicillin. ?Pt to L and D for probable labor, pain control PRN ?Expectant management, pt will be for TOLAC.  Consent previously signed. ? ? ?Lynnda Shields, MD ?Faculty attending, [facility] [employer]. ? ? ? ?

## 2021-08-08 NOTE — Progress Notes (Signed)
Subjective: ?Interval History: has complaints contractions q 3-4 minutes.   ?Called by nursing staff due to resumption of contractions.  No report of vaginal bleeding.  Pt has received bolus of 250 ml LR and IV fentanyl.  When pt was seen she still had intermittent contractions, but did appear comfortable in between. ? ?Objective: ?Vital signs in last 24 hours: ?Temp:  [97.3 ?F (36.3 ?C)-98.6 ?F (37 ?C)] 98.3 ?F (36.8 ?C) (03/07 0344) ?Pulse Rate:  [71-94] 71 (03/07 0344) ?Resp:  [15-19] 17 (03/07 0344) ?BP: (104-121)/(58-73) 104/58 (03/07 0344) ?SpO2:  [98 %-100 %] 100 % (03/07 0344) ?Weight change:  ? ?Intake/Output from previous day: ?03/06 0701 - 03/07 0700 ?In: -  ?Out: 300 [Urine:300] ?Intake/Output this shift: ?No intake/output data recorded. ? ?Fetal tracing: baseline 130's, moderate variability, accels noted, occasional small variable decels, category 1 strip ?Toco: q 3-4 minutes trending toward q 2 minutes ?SVE: deferred at this time ? ?Lab Results: ?Recent Labs  ?  08/07/21 ?0842  ?WBC 16.6*  ?HGB 10.8*  ?HCT 32.0*  ?PLT 148*  ? ? ?I have reviewed the patient's current medications. ? ?Assessment/Plan: ?PPROM, preterm contractions ?Pt continues latency antibiotics ?She is s/p BMZ x 2 and magnesium sulfate ?If pt feels pelvic pressure or has worsening pain, will need to have cervical exam ?Continue expectant management for now. ?  ? ? LOS: 2 days  ? ?Sherri Flores ?08/08/2021,6:11 AM  ?

## 2021-08-08 NOTE — Discharge Summary (Signed)
? ?  Postpartum Discharge Summary ? ?   ?Patient Name: Sherri Flores ?DOB: 19-Mar-1991 ?MRN: [id number] ? ?Date of admission: 08/05/2021 ?Delivery date:08/08/2021  ?Delivering provider: Julianne Handler  ?Date of discharge: 08/09/2021 ? ?Admitting diagnosis: PPROM at 32/1 ? 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 delivered ?Post partum procedures: None ?Augmentation: N/A ?Complications: None ? ?Hospital course: Patient admitted for PPROM and was on Mg x 24hs and received antenatal steroids, in addition to latency abx. She eventually went into full preterm labor and delivered on 3/7 without issue.  ?Membrane Rupture Time/Date: 11:45 PM ,08/05/2021   ?Delivery Method:VBAC, Spontaneous  ?Episiotomy: None  ?Lacerations:  Periurethral;Labial  (not repaired) ?Patient had an uncomplicated postpartum course.  She is ambulating, tolerating a regular diet, passing flatus, and urinating well. Patient is discharged home in stable condition on 08/09/21 and desired d/c to home. Patient desired OCPs for birth control, which she was told can be given at her 4-6wk PP visit.  ? ?RPRs 1:64 on admit and was 1:32 with her 28wk labs. Since different labs and testing is sooner than usual, no additional treatments done during the pregnancy  ? ?She will need a 2h PP GTT at her PP visit ? ?Newborn Data: ?Birth date:08/08/2021  ?Birth time:12:40 PM  ?Gender:Female  ?Living status:Living  ?Apgars:8 ,9  ?Weight:1910 g  ? ?Magnesium Sulfate received: Yes: Neuroprotection ?BMZ received: Yes ?Rhophylac:N/A ?MMR:N/A ?T-DaP:Given prenatally ?Flu: No ?Transfusion:No ? ?Physical exam  ?Vitals:  ? 08/08/21 1953 08/09/21 0045 08/09/21 2130 08/09/21 0848  ?BP:  121/78 114/68 120/75  ?Pulse:  72 70 85  ?Resp: _0 ?Temp: 97.8 ?F (36.6 ?C) 97.7 ?F (36.5 ?C) 98 ?F (36.7 ?C) 98.3 ?F (36.8 ?C)   ?TempSrc: Oral Oral Oral Oral  ?SpO2: 100% 100% 100% 99%  ?Weight:      ?Height:      ? ?General: alert ?Lochia: appropriate ?Uterine Fundus: firm, nttp ?Incision: N/A ?DVT Evaluation: No evidence of DVT seen on physical exam. ?Labs: ?Lab Results  ?Component Value Date  ? WBC 17.5 (H) 08/08/2021  ? HGB 10.6 (L) 08/08/2021  ? HCT 30.7 (L) 08/08/2021  ? MCV 92.7 08/08/2021  ? PLT 141 (L) 08/08/2021  ? ?CMP Latest Ref Rng & Units 02/07/2021  ?Glucose 70 - 99 mg/dL 104(H)  ?BUN 6 - 20 mg/dL 7  ?Creatinine 0.44 - 1.00 mg/dL 0.60  ?Sodium 135 - 145 mmol/L 139  ?Potassium 3.5 - 5.1 mmol/L 3.3(L)  ?Chloride 98 - 111 mmol/L 109  ?CO2 22 - 32 mmol/L 22  ?Calcium 8.9 - 10.3 mg/dL 9.4  ?Total Protein 6.5 - 8.1 g/dL 8.4(H)  ?Total Bilirubin 0.3 - 1.2 mg/dL 0.3  ?Alkaline Phos 38 - 126 U/L 61  ?AST 15 - 41 U/L 21  ?ALT 0 - 44 U/L 24  ? ?Edinburgh Score: ?No flowsheet data found. ? ? ?After visit meds:  ?Allergies as of 08/09/2021   ?No Known Allergies ?  ? ?  ?Medication List  ?  ? ?STOP taking these medications   ? ?Accu-Chek Guide w/Device Kit ?  ?Accu-Chek Softclix Lancets lancets ?  ?glucose blood test strip ?  ?prenatal vitamin w/FE, FA 27-1 MG Tabs tablet ?  ? ?  ? ?TAKE these medications   ? ?acetaminophen 325 MG tablet ?Commonly known as: Tylenol ?Take 2 tablets (650 mg total) by mouth  every 4 (four) hours as needed (for pain scale < 4). ?  ?ibuprofen 600 MG tablet ?Commonly known as: ADVIL ?Take 1 tablet (600 mg total) by mouth every 6 (six) hours as needed. ?  ? ?  ? ? ? ?Discharge home in stable condition ?Infant Feeding: Bottle ?Infant Disposition:NICU ?Discharge instruction: per After Visit Summary and Postpartum booklet. ?Activity: Advance as tolerated. Pelvic rest for 6 weeks.  ?Diet: routine diet ?Future Appointments: ?Future Appointments  ?Date Time Provider [place]  ?09/20/2021  8:20 AM [facility]  ?09/20/2021  9:15 AM Anyanwu, Sallyanne Havers, MD [facility]  ? ?Follow up Visit: ? Follow-up  Information   ? ? [facility] for [employer] at [facility]. Go in 42 day(s).   ?Specialty: Obstetrics and Gynecology ?Why: routine after baby visit ?Contact information: ?[address] ?[place] [id number] ?[phone] ? ?  ?  ? ?  ?  ? ?  ? ? ? ?Please schedule this patient for a In person postpartum visit in 6 weeks with the following provider: Any provider. ?Additional Postpartum F/U:2 hour GTT  ?Low risk pregnancy complicated by: GDM ?Delivery mode:  VBAC, Spontaneous  ?Anticipated Birth Control:  OCPs ? ? ?08/09/2021 ?Aletha Halim, MD ? ? ? ?

## 2021-08-08 NOTE — Anesthesia Procedure Notes (Signed)
Epidural ?Patient location during procedure: OB ?Start time: 08/08/2021 9:24 AM ?End time: 08/08/2021 9:43 AM ? ?Staffing ?Anesthesiologist: Lowella Curb, MD ?Performed: other anesthesia staff  ? ?Preanesthetic Checklist ?Completed: patient identified, IV checked, site marked, risks and benefits discussed, surgical consent, monitors and equipment checked, pre-op evaluation and timeout performed ? ?Epidural ?Patient position: sitting ?Prep: ChloraPrep ?Patient monitoring: heart rate, cardiac monitor, continuous pulse ox and blood pressure ?Approach: midline ?Location: L2-L3 ?Injection technique: LOR saline ? ?Needle:  ?Needle type: Tuohy  ?Needle gauge: 17 G ?Needle length: 9 cm ?Needle insertion depth: 4 cm ?Catheter type: closed end flexible ?Catheter size: 20 Guage ?Catheter at skin depth: 8 cm ?Test dose: negative ? ?Assessment ?Events: blood not aspirated, injection not painful, no injection resistance, no paresthesia and negative IV test ? ?Additional Notes ?Epidural placed by SRNA under direct supervisionReason for block:procedure for pain ? ? ? ?

## 2021-08-09 MED ORDER — IBUPROFEN 600 MG PO TABS: 600.0000 mg | ORAL_TABLET | Freq: Four times a day (QID) | ORAL | 0 refills | Status: DC | PRN

## 2021-08-09 MED ORDER — ACETAMINOPHEN 325 MG PO TABS
650.0000 mg | ORAL_TABLET | ORAL | Status: DC | PRN
Start: 2021-08-09 — End: 2021-09-20

## 2021-08-09 NOTE — Progress Notes (Signed)
Discharge instructions and prescriptions given to pt. Discussed post vaginal delivery care, signs and symptoms to report to the MD, upcoming appointments, and meds. Pt verbalizes understanding and has no questions or concerns at this time. Pt discharged home in stable condition. ?

## 2021-08-09 NOTE — Discharge Instructions (Signed)
Vaginal Delivery, Care After Refer to this sheet in the next few weeks. These discharge instructions provide you with information on caring for yourself after delivery. Your caregiver may also give you specific instructions. Your treatment has been planned according to the most current medical practices available, but problems sometimes occur. Call your caregiver if you have any problems or questions after you go home. HOME CARE INSTRUCTIONS Take over-the-counter or prescription medicines only as directed by your caregiver or pharmacist. Do not drink alcohol, especially if you are breastfeeding or taking medicine to relieve pain. Do not smoke tobacco. Continue to use good perineal care. Good perineal care includes: Wiping your perineum from back to front Keeping your perineum clean. You can do sitz baths twice a day, to help keep this area clean Do not use tampons, douche or have sex until your caregiver says it is okay. Shower only and avoid sitting in submerged water, aside from sitz baths Wear a well-fitting bra that provides breast support. Eat healthy foods. Drink enough fluids to keep your urine clear or pale yellow. Eat high-fiber foods such as whole grain cereals and breads, Tamm rice, beans, and fresh fruits and vegetables every day. These foods may help prevent or relieve constipation. Avoid constipation with high fiber foods or medications, such as miralax or metamucil Follow your caregiver's recommendations regarding resumption of activities such as climbing stairs, driving, lifting, exercising, or traveling. Talk to your caregiver about resuming sexual activities. Resumption of sexual activities is dependent upon your risk of infection, your rate of healing, and your comfort and desire to resume sexual activity. Try to have someone help you with your household activities and your newborn for at least a few days after you leave the hospital. Rest as much as possible. Try to rest or  take a nap when your newborn is sleeping. Increase your activities gradually. Keep all of your scheduled postpartum appointments. It is very important to keep your scheduled follow-up appointments. At these appointments, your caregiver will be checking to make sure that you are healing physically and emotionally. SEEK MEDICAL CARE IF:  You are passing large clots from your vagina. Save any clots to show your caregiver. You have a foul smelling discharge from your vagina. You have trouble urinating. You are urinating frequently. You have pain when you urinate. You have a change in your bowel movements. You have increasing redness, pain, or swelling near your vaginal incision (episiotomy) or vaginal tear. You have pus draining from your episiotomy or vaginal tear. Your episiotomy or vaginal tear is separating. You have painful, hard, or reddened breasts. You have a severe headache. You have blurred vision or see spots. You feel sad or depressed. You have thoughts of hurting yourself or your newborn. You have questions about your care, the care of your newborn, or medicines. You are dizzy or light-headed. You have a rash. You have nausea or vomiting. You were breastfeeding and have not had a menstrual period within 12 weeks after you stopped breastfeeding. You are not breastfeeding and have not had a menstrual period by the 12th week after delivery. You have a fever. SEEK IMMEDIATE MEDICAL CARE IF:  You have persistent pain. You have chest pain. You have shortness of breath. You faint. You have leg pain. You have stomach pain. Your vaginal bleeding saturates two or more sanitary pads in 1 hour. MAKE SURE YOU:  Understand these instructions. Will watch your condition. Will get help right away if you are not doing well or   get worse. Document Released: 05/18/2000 Document Revised: 10/05/2013 Document Reviewed: 01/16/2012 ExitCare Patient Information 2015 [employer]. This  information is not intended to replace advice given to you by your health care provider. Make sure you discuss any questions you have with your health care provider.  Sitz Bath A sitz bath is a warm water bath taken in the sitting position. The water covers only the hips and butt (buttocks). We recommend using one that fits in the toilet, to help with ease of use and cleanliness. It may be used for either healing or cleaning purposes. Sitz baths are also used to relieve pain, itching, or muscle tightening (spasms). The water may contain medicine. Moist heat will help you heal and relax.  HOME CARE  Take 3 to 4 sitz baths a day. Fill the bathtub half-full with warm water. Sit in the water and open the drain a little. Turn on the warm water to keep the tub half-full. Keep the water running constantly. Soak in the water for 15 to 20 minutes. After the sitz bath, pat the affected area dry. GET HELP RIGHT AWAY IF: You get worse instead of better. Stop the sitz baths if you get worse. MAKE SURE YOU: Understand these instructions. Will watch your condition. Will get help right away if you are not doing well or get worse. Document Released: 06/28/2004 Document Revised: 02/13/2012 Document Reviewed: 09/18/2010 ExitCare Patient Information 2015 [employer]. This information is not intended to replace advice given to you by your health care provider. Make sure you discuss any questions you have with your health care provider.   

## 2021-08-09 NOTE — Lactation Note (Signed)
This note was copied from a baby's chart. ? ?NICU Lactation Consultation Note ? ?Patient Name: Sherri Flores ?Today's Date: 08/09/2021 ?Age:30 hours ? ? ?Subjective ?Reason for consult: Initial assessment; 1st time breastfeeding; NICU baby; Preterm <34wks ? ?NICU RN requested lactation to see Sherri Flores. She is interested in pumping and feeding her EBM to baby Sherri Flores. I brought her electric pump supplies and a manual pump. She will be discharged today and travelling back and forth from home to the NICU. She has two children at home, and she reports that her last experience with breast feeding was negative. However, she would like to provide her EBM to Sherri Flores. ? ?I showed her how to use her manual pump, and she expressed happiness to see colostrum. I educated on oral care in the first few days and recommended that she pump each breast for 10 minutes q3 hours (or more often). ? ?OB RN, Sherri Flores, is going to place an order for a Stork Pump. However, mom's insurance may not cover a Stork Pump, so I will place a referral with Sherri Flores for a DEBP today. I encouraged mom to also call Sherri Flores since she is being discharged today. She states that she has been enrolled. ? ?Lactation to follow up tomorrow or later today. I provided Sherri Flores with our NICU [place] guide and our lactation NICU office number. I encouraged her to log her pumping sessions on paper or an app. She verbalized understanding.  ? ?Objective ?Infant data: ?Mother's Current Feeding Choice: Breast Milk and Formula ? ?Infant feeding assessment ?No data recorded ? ?  ?Maternal data: ?[id number]  ?VBAC, Spontaneous ? ?Current breast feeding challenges:: NICU; preterm; obtaining a breast pump ? ?Previous breastfeeding challenges?: Other (Comment) (states that she had a poor first experience) ? ?Does the patient have breastfeeding experience prior to this delivery?: Yes ?How long did the patient breastfeed?: unknown - states that she attempted ? ?Pumping frequency:  recommended q3 hours; this was her first pumping session ?Pumped volume: 2 mL ?Flange Size: 24 ? ?No data recorded ? ?Sherri Flores: Yes ?Sherri Flores Referral Sent?: Yes ?Pump: Manual (DEBP Kit) ? ?Assessment ? ?Feeding Status: NPO ? ? ?Maternal: ?Milk volume: Normal ? ? ?Intervention/Plan ?Interventions: Breast feeding basics reviewed; Education; Hand pump; Coconut oil ? ?Tools: Pump; Flanges ?Pump Education: Setup, frequency, and cleaning; Milk Storage ? ?Plan: ?Consult Status: Follow-up ? ?NICU Follow-up type: New admission follow up; Verify onset of copious milk; Verify absence of engorgement ? ? ? ?Sherri Flores ?08/09/2021, 10:39 AM ?

## 2021-08-09 NOTE — Anesthesia Postprocedure Evaluation (Signed)
Anesthesia Post Note ? ?Patient: Sherri Flores ? ?Procedure(s) Performed: AN AD HOC LABOR EPIDURAL ? ?  ? ?Patient location during evaluation: OB High Risk ?Anesthesia Type: Epidural ?Level of consciousness: awake, oriented and awake and alert ?Flores management: Flores level controlled ?Vital Signs Assessment: post-procedure vital signs reviewed and stable ?Respiratory status: spontaneous breathing, respiratory function stable and nonlabored ventilation ?Cardiovascular status: stable ?Postop Assessment: adequate PO intake, able to ambulate, patient able to bend at knees, no apparent nausea or vomiting, no headache and no backache ?Anesthetic complications: no ? ? ?No notable events documented. ? ?Last Vitals:  ?Vitals:  ? 08/09/21 0433 08/09/21 0848  ?BP: 114/68 120/75  ?Pulse: 70 85  ?Resp: 18 18  ?Temp: 36.7 ?C 36.8 ?C  ?SpO2: 100% 99%  ?  ?Last Flores:  ?Vitals:  ? 08/09/21 0848  ?TempSrc: Oral  ?PainSc:   ? ?Flores Goal:   ? ?  ?  ?  ?  ?  ?  ?  ? ?Sherri Flores ? ? ? ? ?

## 2021-08-09 NOTE — Progress Notes (Signed)
Patient screened out for psychosocial assessment since none of the following apply: °Psychosocial stressors documented in mother or baby's chart °Gestation less than 32 weeks °Code at delivery  °Infant with anomalies °Please contact the Clinical Social Worker if specific needs arise, by MOB's request, or if MOB scores greater than 9/yes to question 10 on Edinburgh Postpartum Depression Screen. ° °Trustin Chapa Boyd-Gilyard, MSW, LCSW °Clinical Social Work °[phone] °  °

## 2021-08-10 ENCOUNTER — Ambulatory Visit: Payer: Self-pay

## 2021-08-10 LAB — SURGICAL PATHOLOGY

## 2021-08-10 NOTE — Lactation Note (Signed)
This note was copied from a baby's chart. ?Lactation Consultation Note ? ?Patient Name: Sherri Flores ?Today's Date: 08/10/2021 ?  ?Age:30 hours ? ?Lactation followed up with Jolaine Artist, BF Coordinator at [facility], this morning, and she indicated that Sherri Flores spoke with [facility] on 3/8. Sherri Flores indicated on that phone call that she would prefer to continue to use her hand pump, and that she did not want to obtain a DEBP at this time.  ? ?Walker Shadow ?08/10/2021, 8:55 AM ? ? ? ?

## 2021-08-11 ENCOUNTER — Telehealth: Payer: Self-pay | Admitting: *Deleted

## 2021-08-11 NOTE — Telephone Encounter (Signed)
Denyse Amass from health department called to followup on syphilis treatment for patient. I reviewed chart and informed of treatment with PCN x3 in October, follow up rpr's, and plan for more titers to be drawn postpartum. ?Nancy Fetter ?

## 2021-08-15 ENCOUNTER — Ambulatory Visit: Payer: Medicaid Other

## 2021-08-16 ENCOUNTER — Encounter: Payer: Medicaid Other | Admitting: Obstetrics and Gynecology

## 2021-08-18 ENCOUNTER — Telehealth (HOSPITAL_COMMUNITY): Payer: Self-pay | Admitting: *Deleted

## 2021-08-18 NOTE — Telephone Encounter (Signed)
Mom reports feeling good physically, but "blah' emotionally as she waits for baby to come home from NICU. No concerns about herself at this time. EPDS=8  (no hospital score) Discussed reaching out to provider if anxiety or sadness persists. Mom agrees. ? ?Duffy Rhody, RN  08-18-2021 at 12:35pm ?

## 2021-08-19 ENCOUNTER — Ambulatory Visit: Payer: Self-pay

## 2021-08-19 NOTE — Lactation Note (Signed)
This note was copied from a baby's chart. ?Lactation Consultation Note ? ?Patient Name: Sherri Flores ?Today's Date: 08/19/2021 ?Reason for consult: Follow-up assessment;Late-preterm 34-36.6wks;1st time breastfeeding;NICU baby ?Age:30 days ? ?Visited with mom of 98 days old LPI NICU female, she's a P3 and reports that baby will be getting 100% formula; she's still pumping but just for comfort; she's not longer bringing in her milk because of the small amounts she's getting. Encouraged mom to bring any amount of milk that she might have available for the weaning process before transition to 100% formula diet, baby is currently on breastmilk with Similac 30 calorie formula added. Reviewed engorgement prevention/treatment, mom denies any S/S of engorgement or any discomfort. NICU [facility] services are completed at this time. ? ?Feeding ?Mother's Current Feeding Choice: Formula ? ?Interventions ?Interventions: Education ? ?Discharge ?Discharge Education: Engorgement and breast care ? ?Consult Status ?Consult Status: Complete ?Date: 08/19/21 ?Follow-up type: Call as needed ? ? ?Sherri Flores ?08/19/2021, 10:47 AM ? ? ? ?

## 2021-09-18 ENCOUNTER — Other Ambulatory Visit: Payer: Self-pay

## 2021-09-18 DIAGNOSIS — O24410 Gestational diabetes mellitus in pregnancy, diet controlled: Secondary | ICD-10-CM

## 2021-09-18 DIAGNOSIS — O98111 Syphilis complicating pregnancy, first trimester: Secondary | ICD-10-CM

## 2021-09-18 NOTE — Addendum Note (Signed)
Addended by: Isabell Jarvis on: 09/18/2021 04:36 PM ? ? Modules accepted: Orders ? ?

## 2021-09-19 NOTE — Progress Notes (Signed)
? ? ?Post Partum Visit Note ? ?Sherri Flores is a 31 y.o. [phone] female who presents for a postpartum visit. She is 6 weeks postpartum following a normal spontaneous vaginal delivery.  I have fully reviewed the prenatal and intrapartum course. The delivery was at 32.4 gestational weeks after PPROM.  Anesthesia: epidural. Postpartum course has been uncomplicated. Baby is doing well, stayed in NICU for 20 days. Baby is feeding by bottle - Similac Neosure, switching to [employer] Gentle this week. Bleeding staining only. Bowel function is normal. Bladder function is normal. Patient is not sexually active. Contraception method is none. Postpartum depression screening: negative. ? ? ?The pregnancy intention screening data noted above was reviewed. Potential methods of contraception were discussed. The patient elected to proceed with No data recorded. ? ? Edinburgh Postnatal Depression Scale - 09/20/21 0831   ? ?  ? Edinburgh Postnatal Depression Scale:  In the Past 7 Days  ? I have been able to laugh and see the funny side of things. 0   ? I have looked forward with enjoyment to things. 0   ? I have blamed myself unnecessarily when things went wrong. 2   ? I have been anxious or worried for no good reason. 0   ? I have felt scared or panicky for no good reason. 0   ? Things have been getting on top of me. 0   ? I have been so unhappy that I have had difficulty sleeping. 0   ? I have felt sad or miserable. 0   ? I have been so unhappy that I have been crying. 0   ? The thought of harming myself has occurred to me. 0   ? Edinburgh Postnatal Depression Scale Total 2   ? ?  ?  ? ?  ? ? ?Health Maintenance Due  ?Topic Date Due  ? URINE MICROALBUMIN  Never done  ? ? ?The following portions of the patient's history were reviewed and updated as appropriate: allergies, current medications, past family history, past medical history, past social history, past surgical history, and problem list. ? ?Review of  Systems ?Pertinent items noted in HPI and remainder of comprehensive ROS otherwise negative. ? ?Objective:  ?BP 115/84   Pulse 90   Wt 127 lb 11.2 oz (57.9 kg)   LMP 12/23/2020 (Approximate)   Breastfeeding No   BMI 24.13 kg/m?   ? ?General:  alert and no distress  ? Breasts:  not indicated  ?Lungs: clear to auscultation bilaterally  ?Heart:  regular rate and rhythm  ?Abdomen: soft, non-tender; bowel sounds normal; no masses,  no organomegaly   ?     ?Assessment:  ?Normal postpartum exam.  ? ?Plan:  ? ?Essential components of care per ACOG recommendations: ? ?1.  Mood and well being: Patient with negative depression screening today. Reviewed local resources for support.  ?- Patient tobacco use? Yes. Patient desires to quit? Yes.offered nicotine replacement therapy which was accepted.  Nicotine patches prescribed. ?- hx of drug use? No.   ? ?2. Infant care and feeding:  ?-Patient currently breastmilk feeding? No.  ?-Social determinants of health (SDOH) reviewed in EPIC.  Reports food insecurity, referred to food market. ? ?3. Sexuality, contraception and birth spacing ?- Patient does not want a pregnancy in the next year.  Desired family size is 3 children.  ?- Reviewed reproductive life planning. Reviewed contraceptive methods based on pt preferences and effectiveness.  Patient desired Oral Contraceptive today, this was  prescribed. Yaz prescribed. ?- Discussed birth spacing of 18 months ? ?4. Sleep and fatigue ?-Encouraged family/partner/community support of 4 hrs of uninterrupted sleep to help with mood and fatigue ? ?5. Physical Recovery  ?- Discussed patients delivery and complications. She describes her labor as good. ?- Patient had a Vaginal, no problems at delivery. Patient had a  periurethral and labial  laceration. Perineal healing reviewed. Patient expressed understanding ?- Patient has urinary incontinence? No. ?- Patient is safe to resume physical and sexual activity ? ?6.  Health Maintenance ?- HM  due items addressed Yes ?- Last pap smear  ?Diagnosis  ?Date Value Ref Range Status  ?03/15/2021   Final  ? - Negative for intraepithelial lesion or malignancy (NILM)  ? Pap smear not done at today's visit.  ?-Breast Cancer screening indicated? No.  ? ?7. Gestational Diabetes ?- Will get 2 hr GTT PP ?- PCP follow up ? ?8. Syphilis ?- Titer to be checked soon, orders placed. will follow up results and manage accordingly. ? ? ?Jaynie Collins, MD ?Center for [employer], [facility]  ?

## 2021-09-20 ENCOUNTER — Other Ambulatory Visit: Payer: Medicaid Other

## 2021-09-20 ENCOUNTER — Ambulatory Visit (INDEPENDENT_AMBULATORY_CARE_PROVIDER_SITE_OTHER): Payer: Medicaid Other | Admitting: Obstetrics & Gynecology

## 2021-09-20 ENCOUNTER — Encounter: Payer: Self-pay | Admitting: Obstetrics & Gynecology

## 2021-09-20 DIAGNOSIS — Z30011 Encounter for initial prescription of contraceptive pills: Secondary | ICD-10-CM

## 2021-09-20 DIAGNOSIS — Z8632 Personal history of gestational diabetes: Secondary | ICD-10-CM | POA: Diagnosis not present

## 2021-09-20 DIAGNOSIS — Z5941 Food insecurity: Secondary | ICD-10-CM

## 2021-09-20 DIAGNOSIS — A539 Syphilis, unspecified: Secondary | ICD-10-CM

## 2021-09-20 DIAGNOSIS — Z716 Tobacco abuse counseling: Secondary | ICD-10-CM

## 2021-09-20 MED ORDER — NICOTINE 21-14-7 MG/24HR TD KIT: 1.0000 | PACK | Freq: Every day | TRANSDERMAL | 12 refills | Status: DC

## 2021-09-20 MED ORDER — DROSPIRENONE-ETHINYL ESTRADIOL 3-0.02 MG PO TABS: 1.0000 | ORAL_TABLET | Freq: Every day | ORAL | 11 refills | Status: DC

## 2021-09-28 ENCOUNTER — Encounter: Payer: Self-pay | Admitting: Obstetrics & Gynecology

## 2021-09-28 ENCOUNTER — Other Ambulatory Visit: Payer: Medicaid Other

## 2021-09-28 NOTE — Telephone Encounter (Signed)
Yes, please.

## 2021-10-09 ENCOUNTER — Ambulatory Visit (INDEPENDENT_AMBULATORY_CARE_PROVIDER_SITE_OTHER): Payer: Medicaid Other | Admitting: General Practice

## 2021-10-09 ENCOUNTER — Other Ambulatory Visit: Payer: Medicaid Other

## 2021-10-09 VITALS — BP 122/79 | HR 75 | Ht 61.0 in | Wt 131.0 lb

## 2021-10-09 DIAGNOSIS — Z3042 Encounter for surveillance of injectable contraceptive: Secondary | ICD-10-CM

## 2021-10-09 DIAGNOSIS — O98111 Syphilis complicating pregnancy, first trimester: Secondary | ICD-10-CM

## 2021-10-09 DIAGNOSIS — O24410 Gestational diabetes mellitus in pregnancy, diet controlled: Secondary | ICD-10-CM

## 2021-10-09 MED ORDER — MEDROXYPROGESTERONE ACETATE 150 MG/ML IM SUSP
150.0000 mg | Freq: Once | INTRAMUSCULAR | Status: AC
  Administered 2021-10-09: 150 mg via INTRAMUSCULAR

## 2021-10-09 NOTE — Progress Notes (Signed)
Sherri Flores Pain here for Depo-Provera Injection. Injection administered without complication. Patient will return in 3 months for next injection between July 24 and August 7. Next annual visit due April 2024.  ? ?Marylynn Pearson, RN ?10/09/2021  8:31 AM  ?

## 2021-10-10 ENCOUNTER — Encounter: Payer: Self-pay | Admitting: Obstetrics & Gynecology

## 2021-10-10 LAB — RPR, QUANT+TP ABS (REFLEX): Rapid Plasma Reagin, Quant: 1:16 {titer} — ABNORMAL HIGH

## 2021-10-10 LAB — RPR: RPR Ser Ql: REACTIVE — AB

## 2021-10-10 LAB — T.PALLIDUM AB, TOTAL: Treponema pallidum Antibodies: REACTIVE — AB

## 2021-10-10 LAB — GLUCOSE TOLERANCE, 2 HOURS
Glucose, 2 hour: 101 mg/dL (ref 70–139)
Glucose, GTT - Fasting: 86 mg/dL (ref 70–99)

## 2021-10-11 ENCOUNTER — Encounter: Payer: Self-pay | Admitting: Obstetrics & Gynecology

## 2021-11-15 ENCOUNTER — Ambulatory Visit: Payer: Medicaid Other | Admitting: Obstetrics and Gynecology

## 2022-01-02 ENCOUNTER — Other Ambulatory Visit: Payer: Self-pay

## 2022-01-02 ENCOUNTER — Ambulatory Visit (INDEPENDENT_AMBULATORY_CARE_PROVIDER_SITE_OTHER): Payer: Medicaid Other

## 2022-01-02 VITALS — BP 114/81 | HR 78 | Wt 134.4 lb

## 2022-01-02 DIAGNOSIS — Z3042 Encounter for surveillance of injectable contraceptive: Secondary | ICD-10-CM | POA: Diagnosis not present

## 2022-01-02 MED ORDER — MEDROXYPROGESTERONE ACETATE 150 MG/ML IM SUSP
150.0000 mg | Freq: Once | INTRAMUSCULAR | Status: AC
  Administered 2022-01-02: 150 mg via INTRAMUSCULAR

## 2022-01-02 NOTE — Progress Notes (Signed)
Lizzette Leotis Pain here for Depo-Provera Injection. Injection administered without complication. Patient will return in 3 months for next injection between 03/20/22 and 04/03/22. Next annual visit due April 2024.   Pt states she is considering BTL. Recommended pt schedule appt with OB/GYN to discuss.  Marjo Bicker, RN 01/02/2022  11:07 AM

## 2022-03-20 ENCOUNTER — Ambulatory Visit: Payer: Medicaid Other

## 2022-03-29 ENCOUNTER — Ambulatory Visit: Payer: Medicaid Other

## 2022-04-04 ENCOUNTER — Other Ambulatory Visit: Payer: Self-pay

## 2022-04-04 ENCOUNTER — Ambulatory Visit (INDEPENDENT_AMBULATORY_CARE_PROVIDER_SITE_OTHER): Payer: Medicaid Other

## 2022-04-04 VITALS — BP 121/76 | HR 93 | Wt 141.0 lb

## 2022-04-04 DIAGNOSIS — Z3042 Encounter for surveillance of injectable contraceptive: Secondary | ICD-10-CM | POA: Diagnosis not present

## 2022-04-04 LAB — POCT PREGNANCY, URINE: Preg Test, Ur: NEGATIVE

## 2022-04-04 MED ORDER — MEDROXYPROGESTERONE ACETATE 150 MG/ML IM SUSP
150.0000 mg | Freq: Once | INTRAMUSCULAR | Status: AC
  Administered 2022-04-04: 150 mg via INTRAMUSCULAR

## 2022-04-04 NOTE — Progress Notes (Signed)
Sherri Flores here for Depo-Provera Injection. It has been 13 weeks 1 day since last injection. UPT today is negative. Injection administered without complication. Patient will return in 3 months for next injection between 06/20/22 and 07/04/22. Next annual visit due April 2024.   [name], RN 04/04/2022  9:10 AM

## 2022-06-20 ENCOUNTER — Ambulatory Visit: Payer: Medicaid Other

## 2022-06-28 ENCOUNTER — Ambulatory Visit: Payer: Medicaid Other

## 2022-07-04 ENCOUNTER — Other Ambulatory Visit: Payer: Self-pay

## 2022-07-04 ENCOUNTER — Ambulatory Visit (INDEPENDENT_AMBULATORY_CARE_PROVIDER_SITE_OTHER): Payer: Medicaid Other

## 2022-07-04 DIAGNOSIS — Z3042 Encounter for surveillance of injectable contraceptive: Secondary | ICD-10-CM | POA: Diagnosis not present

## 2022-07-04 MED ORDER — MEDROXYPROGESTERONE ACETATE 150 MG/ML IM SUSP
150.0000 mg | Freq: Once | INTRAMUSCULAR | Status: AC
  Administered 2022-07-04: 150 mg via INTRAMUSCULAR

## 2022-07-04 NOTE — Progress Notes (Signed)
Keyarra Franne Forts here for Depo-Provera Injection. Injection administered without complication. Patient will return in 3 months for next injection between 04/18 and 05/02. Next annual visit due April 2024.   Martina Sinner, RN 07/04/2022  9:08 AM

## 2022-07-04 NOTE — Addendum Note (Signed)
Addended by: Woodroe Vogan D on: 07/04/2022 10:52 AM   Modules accepted: Level of Service  

## 2022-09-21 ENCOUNTER — Ambulatory Visit: Payer: Medicaid Other | Admitting: Obstetrics & Gynecology

## 2022-09-25 ENCOUNTER — Ambulatory Visit (INDEPENDENT_AMBULATORY_CARE_PROVIDER_SITE_OTHER): Payer: Medicaid Other | Admitting: Obstetrics and Gynecology

## 2022-09-25 ENCOUNTER — Other Ambulatory Visit: Payer: Self-pay

## 2022-09-25 ENCOUNTER — Encounter: Payer: Self-pay | Admitting: Obstetrics and Gynecology

## 2022-09-25 ENCOUNTER — Other Ambulatory Visit (HOSPITAL_COMMUNITY)
Admission: RE | Admit: 2022-09-25 | Discharge: 2022-09-25 | Disposition: A | Discharge status: Home / Self Care | Payer: Medicaid Other | Source: Ambulatory Visit | Attending: Obstetrics & Gynecology | Admitting: Obstetrics & Gynecology

## 2022-09-25 VITALS — BP 133/99 | HR 97 | Ht 61.0 in | Wt 153.4 lb

## 2022-09-25 DIAGNOSIS — Z01419 Encounter for gynecological examination (general) (routine) without abnormal findings: Secondary | ICD-10-CM

## 2022-09-25 DIAGNOSIS — Z113 Encounter for screening for infections with a predominantly sexual mode of transmission: Secondary | ICD-10-CM

## 2022-09-25 DIAGNOSIS — Z3042 Encounter for surveillance of injectable contraceptive: Secondary | ICD-10-CM

## 2022-09-25 MED ORDER — MEDROXYPROGESTERONE ACETATE 150 MG/ML IM SUSY
150.0000 mg | PREFILLED_SYRINGE | Freq: Once | INTRAMUSCULAR | Status: AC
  Administered 2022-09-25: 150 mg via INTRAMUSCULAR

## 2022-09-25 MED ORDER — IBUPROFEN 600 MG PO TABS: 600.0000 mg | ORAL_TABLET | Freq: Four times a day (QID) | ORAL | 1 refills | Status: DC | PRN

## 2022-09-25 NOTE — Progress Notes (Signed)
ANNUAL EXAM Patient name: Sherri Flores MRN [id number]  Date of birth: 11/30/1990 Chief Complaint:   Annual Exam  History of Present Illness:   Sherri Flores is a 32 y.o. [phone] being seen today for a routine annual exam.  Current complaints: annual, continue depo  Menstrual concerns? No  light periods when on depo Has intermittent cramping with and without menses, wants rx for ibuprofen Breast or nipple changes? No  Contraception use? Yes depo provera injection Sexually active? Yes   Patient's last menstrual period was 09/11/2022 (approximate).   Upstream - 09/25/22 0855       Pregnancy Intention Screening   Does the patient want to become pregnant in the next year? No    Does the patient's partner want to become pregnant in the next year? No    Would the patient like to discuss contraceptive options today? No      Contraception Wrap Up   Current Method Hormonal Injection    End Method Hormonal Injection    Contraception Counseling Provided No            The pregnancy intention screening data noted above was reviewed. Potential methods of contraception were discussed. The patient elected to proceed with Hormonal Injection.   Last pap     Component Value Date/Time   DIAGPAP  03/15/2021 1052    - Negative for intraepithelial lesion or malignancy (NILM)   ADEQPAP  03/15/2021 1052    Satisfactory for evaluation; transformation zone component PRESENT.    High Risk HPV: Positive  Adequacy:  Satisfactory for evaluation, transformation zone component PRESENT  Diagnosis:  Atypical squamous cells of undetermined significance (ASC-US)  Last mammogram: n/a Last colonoscopy: n/a     09/25/2022    8:54 AM 07/19/2021    3:34 PM 07/05/2021   10:44 AM 06/06/2021    4:50 PM 04/10/2021   11:29 AM  Depression screen PHQ 2/9  Decreased Interest 0 0 0 0 0  Down, Depressed, Hopeless 0 0 0 0 0  PHQ - 2 Score 0 0 0 0 0  Altered sleeping 0 0 0 0 1  Tired, decreased energy 0  0 0 0 1  Change in appetite 0 0 0 0 0  Feeling bad or failure about yourself  0 0 0 0 0  Trouble concentrating 0 0 0 0 0  Moving slowly or fidgety/restless 0 0 0 0 0  Suicidal thoughts 0 0 0 0 0  PHQ-9 Score 0 0 0 0 2  Difficult doing work/chores  Not difficult at all Not difficult at all          09/25/2022    8:55 AM 07/19/2021    3:34 PM 07/05/2021   10:45 AM 06/06/2021    4:50 PM  GAD 7 : Generalized Anxiety Score  Nervous, Anxious, on Edge 0 0 0 1  Control/stop worrying 0 0 0 1  Worry too much - different things 0 0 0 1  Trouble relaxing 0 0 0 0  Restless 0 0 0 0  Easily annoyed or irritable 0 0 0 0  Afraid - awful might happen 0 0 0 0  Total GAD 7 Score 0 0 0 3  Anxiety Difficulty  Not difficult at all Not difficult at all      Review of Systems:   Pertinent items are noted in HPI Denies any headaches, blurred vision, fatigue, shortness of breath, chest pain, abdominal pain, abnormal vaginal discharge/itching/odor/irritation, problems with periods, bowel movements,  urination, or intercourse unless otherwise stated above. Pertinent History Reviewed:  Reviewed past medical,surgical, social and family history.  Reviewed problem list, medications and allergies. Physical Assessment:   Vitals:   09/25/22 0847 09/25/22 0854  BP: (!) 128/94 (!) 133/99  Pulse: 95 97  Weight: 153 lb 6.4 oz (69.6 kg)   Height:  (1.549 m)   Body mass index is 28.98 kg/m.        Physical Examination:   General appearance - well appearing, and in no distress  Mental status - alert, oriented to person, place, and time  Psych:  She has a normal mood and affect  Skin - warm and dry, normal color, no suspicious lesions noted  Chest - effort normal, all lung fields clear to auscultation bilaterally  Heart - normal rate and regular rhythm  Breasts - breasts appear normal, no suspicious masses, no skin or nipple changes or  axillary nodes  Abdomen - soft, nontender, nondistended, no masses or  organomegaly  Pelvic - deferred  Extremities:  No swelling or varicosities noted  Chaperone present for exam  No results found for this or any previous visit (from the past 24 hour(s)).    Assessment & Plan:  1. Well woman exam with routine gynecological exam - Cervical cancer screening: Discussed guidelines. Pap with HPV due 2025 - GC/CT: accepts - Birth Control: Depo - Breast Health: Encouraged self breast awareness/SBE. Teaching provided.  - F/U 12 months and prn - NSAIDs for cramping - Cervicovaginal ancillary only( [facility]) - RPR - HIV Antibody (routine testing w rflx) - Hepatitis C Antibody - Hepatitis B Surface AntiGEN  2. Routine screening for STI (sexually transmitted infection) Screening completed today  - RPR - HIV Antibody (routine testing w rflx) - Hepatitis C Antibody - Hepatitis B Surface AntiGEN  3. Encounter for Depo-Provera contraception Continue depo q3 months - medroxyPROGESTERone Acetate SUSY 150 mg   Orders Placed This Encounter  Procedures   RPR   HIV Antibody (routine testing w rflx)   Hepatitis C Antibody   Hepatitis B Surface AntiGEN    Meds:  Meds ordered this encounter  Medications   medroxyPROGESTERone Acetate SUSY 150 mg    Follow-up: Return for Annual GYN.  Lorriane Shire, MD 09/25/2022 9:09 AM

## 2022-09-25 NOTE — Addendum Note (Signed)
Addended by: Harlon Ditty on: 09/25/2022 01:47 PM   Modules accepted: Orders

## 2022-09-25 NOTE — Patient Instructions (Addendum)
Next depo shot due between July 9th-July 23rd

## 2022-09-26 ENCOUNTER — Other Ambulatory Visit: Payer: Self-pay | Admitting: Obstetrics and Gynecology

## 2022-09-26 DIAGNOSIS — N760 Acute vaginitis: Secondary | ICD-10-CM

## 2022-09-26 LAB — CERVICOVAGINAL ANCILLARY ONLY
Bacterial Vaginitis (gardnerella): POSITIVE — AB
Candida Glabrata: NEGATIVE
Candida Vaginitis: NEGATIVE
Chlamydia: NEGATIVE
Comment: NEGATIVE
Comment: NEGATIVE
Comment: NEGATIVE
Comment: NEGATIVE
Comment: NEGATIVE
Comment: NORMAL
Neisseria Gonorrhea: NEGATIVE
Trichomonas: NEGATIVE

## 2022-09-26 LAB — HEPATITIS B SURFACE ANTIGEN: Hepatitis B Surface Ag: NEGATIVE

## 2022-09-26 LAB — HIV ANTIBODY (ROUTINE TESTING W REFLEX): HIV Screen 4th Generation wRfx: NONREACTIVE

## 2022-09-26 LAB — HEPATITIS C ANTIBODY: Hep C Virus Ab: NONREACTIVE

## 2022-09-26 LAB — RPR: RPR Ser Ql: REACTIVE — AB

## 2022-09-26 LAB — RPR, QUANT+TP ABS (REFLEX)
Rapid Plasma Reagin, Quant: 1:16 {titer} — ABNORMAL HIGH
T Pallidum Abs: REACTIVE — AB

## 2022-09-26 MED ORDER — METRONIDAZOLE 500 MG PO TABS: 500.0000 mg | ORAL_TABLET | Freq: Two times a day (BID) | ORAL | 0 refills | Status: AC

## 2022-09-27 ENCOUNTER — Encounter: Payer: Self-pay | Admitting: Obstetrics and Gynecology

## 2022-09-28 ENCOUNTER — Other Ambulatory Visit: Payer: Self-pay | Admitting: Obstetrics and Gynecology

## 2022-09-28 DIAGNOSIS — A539 Syphilis, unspecified: Secondary | ICD-10-CM

## 2022-10-16 ENCOUNTER — Ambulatory Visit: Payer: Medicaid Other | Admitting: Internal Medicine

## 2022-10-25 ENCOUNTER — Ambulatory Visit: Payer: Medicaid Other | Admitting: Internal Medicine

## 2022-11-12 ENCOUNTER — Ambulatory Visit: Payer: Medicaid Other | Admitting: Internal Medicine

## 2022-11-12 NOTE — Progress Notes (Deleted)
     Patient: Sherri Flores  DOB: [birth year] MRN: [id number] PCP[facility]      Subjective:  Sherri Flores is a 32 y.o. Female with history of syphilis in 2022 treated with penicillin x 2.  This was found during pregnancy.  Her RPR was 1: 64.  She has had repeat syphilis testing but I do not see where she was treated with penicillin or Doxy again.  On review of labs initial syphilis test was positive on 03/15/2021 with titer 1/64 which trended down to 1: 32 on 07/11/2021.  Of note repeat titer on 08/07/2021 was 1: 64 and then trended down to 1: 16 on 5 8/23 and remained at that level on 09/24/2021.  It is unclear if she was treated between March 2023 to May 2023 as titer was found positive during hospitalization.  Ideally titer should come down fourfold which has occurred in her [place], but this can happen naturally and not always indicated.  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 30 tablet 1   Multiple Vitamins-Minerals (ONE-A-DAY WOMENS PO) Take by mouth.     Prenatal Vit-Fe Fumarate-FA (MULTIVITAMIN-PRENATAL) 27-0.8 MG TABS tablet Take 1 tablet by mouth daily at 12 noon. (Patient not taking: Reported on 01/02/2022)     No facility-administered medications prior to visit.     No Known Allergies  Social History   Tobacco Use   Smoking status: Every Day    Packs/day: .1    Types: Cigarettes    Last attempt to quit: 04/06/2021    Years since quitting: 1.6   Smokeless tobacco: Never  Vaping Use   Vaping Use: Never used  Substance Use Topics   Alcohol use: Yes    Comment: occ    Drug use: Never    Family History  Problem Relation Age of Onset   Hypertension Maternal Grandmother     Objective:  There were no vitals filed for this visit. There is no height or weight on file to calculate BMI.  Physical Exam  Lab Results: Lab Results  Component Value Date   WBC 17.5 (H) 08/08/2021   HGB 10.6 (L) 08/08/2021   HCT 30.7 (L) 08/08/2021   MCV 92.7 08/08/2021   PLT 141 (L) 08/08/2021    Lab Results  Component Value Date   CREATININE 0.60 02/07/2021   BUN 7 02/07/2021   NA 139 02/07/2021   K 3.3 (L) 02/07/2021   CL 109 02/07/2021   CO2 22 02/07/2021    Lab Results  Component Value Date   ALT 24 02/07/2021   AST 21 02/07/2021   ALKPHOS 61 02/07/2021   BILITOT 0.3 02/07/2021     Assessment & Plan:  #late latent syphilis - RPR titer 1: 64 on 3//6/23 - RPR titer 5 8/23 1: 16, on 09/25/2022 1: 16 -Syphillis treates?  Danelle Earthly, MD [facility]   11/12/22  7:21 AM

## 2022-12-11 ENCOUNTER — Ambulatory Visit: Payer: Medicaid Other

## 2022-12-21 ENCOUNTER — Ambulatory Visit (INDEPENDENT_AMBULATORY_CARE_PROVIDER_SITE_OTHER): Payer: Medicaid Other

## 2022-12-21 VITALS — BP 127/73 | HR 93 | Ht 62.0 in | Wt 150.4 lb

## 2022-12-21 DIAGNOSIS — Z3042 Encounter for surveillance of injectable contraceptive: Secondary | ICD-10-CM

## 2022-12-21 MED ORDER — MEDROXYPROGESTERONE ACETATE 150 MG/ML IM SUSY
150.0000 mg | PREFILLED_SYRINGE | Freq: Once | INTRAMUSCULAR | Status: AC
  Administered 2022-12-21: 150 mg via INTRAMUSCULAR

## 2022-12-21 NOTE — Progress Notes (Signed)
Sherri Flores Pain here for Depo-Provera Injection. Injection administered without complication. Patient will return in 3 months for next injection between 10/4 and 10/18. Next annual visit due 09/25/23.    Meryl Crutch, RN 12/21/2022  11:00 AM

## 2023-03-11 ENCOUNTER — Ambulatory Visit: Payer: Medicaid Other

## 2023-03-18 ENCOUNTER — Ambulatory Visit: Payer: Medicaid Other

## 2023-03-21 ENCOUNTER — Ambulatory Visit (INDEPENDENT_AMBULATORY_CARE_PROVIDER_SITE_OTHER): Payer: Medicaid Other

## 2023-03-21 ENCOUNTER — Other Ambulatory Visit: Payer: Self-pay

## 2023-03-21 VITALS — BP 122/76 | HR 96 | Ht 63.0 in | Wt 153.0 lb

## 2023-03-21 DIAGNOSIS — Z3042 Encounter for surveillance of injectable contraceptive: Secondary | ICD-10-CM | POA: Diagnosis not present

## 2023-03-21 MED ORDER — MEDROXYPROGESTERONE ACETATE 150 MG/ML IM SUSY
150.0000 mg | PREFILLED_SYRINGE | Freq: Once | INTRAMUSCULAR | Status: AC
  Administered 2023-03-21: 150 mg via INTRAMUSCULAR

## 2023-03-21 NOTE — Progress Notes (Signed)
Sherri Flores Pain here for Depo-Provera Injection. Injection administered without complication. Patient will return in 3 months for next injection between 06/06/23 and 06/20/23. Next annual visit due 09/25/23. Last pap smear was 03/15/2021.  Quintella Reichert, RN 03/21/2023  9:39 AM

## 2023-04-09 ENCOUNTER — Emergency Department (HOSPITAL_COMMUNITY)
Admission: EM | Admit: 2023-04-09 | Discharge: 2023-04-09 | Disposition: A | Discharge status: Home / Self Care | Payer: Medicaid Other | Attending: Emergency Medicine | Admitting: Emergency Medicine

## 2023-04-09 ENCOUNTER — Other Ambulatory Visit: Payer: Self-pay

## 2023-04-09 DIAGNOSIS — L0231 Cutaneous abscess of buttock: Secondary | ICD-10-CM | POA: Insufficient documentation

## 2023-04-09 DIAGNOSIS — D72829 Elevated white blood cell count, unspecified: Secondary | ICD-10-CM | POA: Insufficient documentation

## 2023-04-09 DIAGNOSIS — L03317 Cellulitis of buttock: Secondary | ICD-10-CM | POA: Insufficient documentation

## 2023-04-09 LAB — COMPREHENSIVE METABOLIC PANEL
ALT: 30 U/L (ref 0–44)
AST: 21 U/L (ref 15–41)
Albumin: 4.2 g/dL (ref 3.5–5.0)
Alkaline Phosphatase: 70 U/L (ref 38–126)
Anion gap: 9 (ref 5–15)
BUN: 8 mg/dL (ref 6–20)
CO2: 22 mmol/L (ref 22–32)
Calcium: 8.9 mg/dL (ref 8.9–10.3)
Chloride: 108 mmol/L (ref 98–111)
Creatinine, Ser: 0.92 mg/dL (ref 0.44–1.00)
GFR, Estimated: >60 mL/min (ref >60)
Glucose, Bld: 100 mg/dL — ABNORMAL HIGH (ref 70–99)
Potassium: 3.7 mmol/L (ref 3.5–5.1)
Sodium: 139 mmol/L (ref 135–145)
Total Bilirubin: 0.6 mg/dL (ref <1.2)
Total Protein: 7.6 g/dL (ref 6.5–8.1)

## 2023-04-09 LAB — CBC
HCT: 40.3 % (ref 36.0–46.0)
Hemoglobin: 13.2 g/dL (ref 12.0–15.0)
MCH: 30.6 pg (ref 26.0–34.0)
MCHC: 32.8 g/dL (ref 30.0–36.0)
MCV: 93.3 fL (ref 80.0–100.0)
Platelets: 198 10*3/uL (ref 150–400)
RBC: 4.32 MIL/uL (ref 3.87–5.11)
RDW: 13.2 % (ref 11.5–15.5)
WBC: 16.8 10*3/uL — ABNORMAL HIGH (ref 4.0–10.5)
nRBC: 0 % (ref 0.0–0.2)

## 2023-04-09 LAB — HCG, SERUM, QUALITATIVE: Preg, Serum: NEGATIVE

## 2023-04-09 NOTE — ED Triage Notes (Addendum)
Pt arrived via POV. C/o abscess in gluteal cleft, and elevated WBCs. Referred by PCP. Abscess began draining today.  Had fever earlier, took an ibuprofen.

## 2023-04-09 NOTE — ED Provider Notes (Signed)
[place] EMERGENCY DEPARTMENT AT [facility] Provider Note   CSN: [id number] Arrival date & time: 04/09/23  1745     History  Chief Complaint  Patient presents with   Abscess   abnormal labs    Sherri Flores is a 32 y.o. female with overall noncontributory past medical history who presents with concern for abscess/cellulitis on right buttock for the last several days.  Patient was referred by her primary care doctor due to elevated white blood cells and concern for possible need for drainage.  She was just placed on Bactrim today and has taken only 1 dose just a few hours prior to arrival.  She denies any systemic fever, chills.  She reports some light drainage out of the site, no previous history of abscesses.  She denies history of IV drug use.   Abscess      Home Medications Prior to Admission medications   Medication Sig Start Date End Date Taking? Authorizing Provider  ibuprofen (ADVIL) 600 MG tablet Take 1 tablet (600 mg total) by mouth every 6 (six) hours as needed. 09/25/22   Lorriane Shire, MD  Multiple Vitamins-Minerals (ONE-A-DAY WOMENS PO) Take by mouth.    [provider]  Prenatal Vit-Fe Fumarate-FA (MULTIVITAMIN-PRENATAL) 27-0.8 MG TABS tablet Take 1 tablet by mouth daily at 12 noon.    [provider]      Allergies    Patient has no known allergies.    Review of Systems   Review of Systems  All other systems reviewed and are negative.   Physical Exam Updated Vital Signs BP 117/85   Pulse 100   Temp 98.4 F (36.9 C) (Oral)   Resp 16   Ht 5\' 3"  (1.6 m)   Wt 68.9 kg   SpO2 100%   BMI 26.93 kg/m  Physical Exam Vitals and nursing note reviewed.  Constitutional:      General: She is not in acute distress.    Appearance: Normal appearance.  HENT:     Head: Normocephalic and atraumatic.  Eyes:     General:        Right eye: No discharge.        Left eye: No discharge.  Cardiovascular:     Rate and Rhythm:  Normal rate and regular rhythm.     Heart sounds: No murmur heard.    No friction rub. No gallop.  Pulmonary:     Effort: Pulmonary effort is normal.     Breath sounds: Normal breath sounds.  Abdominal:     General: Bowel sounds are normal.     Palpations: Abdomen is soft.  Skin:    General: Skin is warm and dry.     Capillary Refill: Capillary refill takes less than 2 seconds.     Comments: Patient with warm, red, indurated cellulitis approximately 6 x 9 cm on the right buttock, there is 1 area of skin irritation/central pore.  Ultrasound examination of the lesion revealed some mild cobblestoning, with no evidence of discernible fluid collection  Neurological:     Mental Status: She is alert and oriented to person, place, and time.  Psychiatric:        Mood and Affect: Mood normal.        Behavior: Behavior normal.     ED Results / Procedures / Treatments   Labs (all labs ordered are listed, but only abnormal results are displayed) Labs Reviewed  CBC - Abnormal; Notable for the following components:  Result Value   WBC 16.8 (*)    All other components within normal limits  COMPREHENSIVE METABOLIC PANEL - Abnormal; Notable for the following components:   Glucose, Bld 100 (*)    All other components within normal limits  HCG, SERUM, QUALITATIVE    EKG None  Radiology No results found.  Procedures Procedures    Medications Ordered in ED Medications - No data to display  ED Course/ Medical Decision Making/ A&P                                 Medical Decision Making Amount and/or Complexity of Data Reviewed Labs: ordered.   This patient is a 32 y.o. female  who presents to the ED for concern of cellulitis vs abscess.   Differential diagnoses prior to evaluation: The emergent differential diagnosis includes, but is not limited to, abscess, cellulitis. This is not an exhaustive differential.   Past Medical History / Co-morbidities / Social History: Overall  noncontributory  Additional history: Chart reviewed. Pertinent results include: Reviewed outpatient family medicine visit, with recommendation for further evaluation in the ED. notably febrile at PCP, but afebrile in the ED after first dose of antibiotics.  White blood cells 17 at PCP, already downtrending after first dose of antibiotics.  Physical Exam: Physical exam performed. The pertinent findings include:  Patient with warm, red, indurated cellulitis approximately 6 x 9 cm on the right buttock, there is 1 area of skin irritation/central pore.  Ultrasound examination of the lesion revealed some mild cobblestoning, with no evidence of discernible fluid collection. Vital signs stable.   Lab Tests/Imaging studies: I personally interpreted labs/imaging and the pertinent results include: CBC notable for leukocytosis but slightly downtrending from earlier today after first dose of antibiotics.  CMP unremarkable, serum pregnancy test negative..  Bedside ultrasound reveals no drainable fluid collection at this time, encouraged continuing to take antibiotics and waiting to see cellulitis response to treatment, extensive return precautions given.   Medications: Encouraged to continue Bactrim for full course  Disposition: After consideration of the diagnostic results and the patients response to treatment, I feel that patient is stable for discharge at this time, close physical exam did not reveal fluid collection that would benefit from abscess drainage on ultrasound.  Her vital signs and white blood cell count have improved since first dose of antibiotics, I think reasonable to allow antibiotics time to take effect with plan for close follow-up.Marland Kitchen   emergency department workup does not suggest an emergent condition requiring admission or immediate intervention beyond what has been performed at this time. The plan is: as above. The patient is safe for discharge and has been instructed to return immediately  for worsening symptoms, change in symptoms or any other concerns.  Final Clinical Impression(s) / ED Diagnoses Final diagnoses:  Cellulitis of buttock, right  Leukocytosis, unspecified type    Rx / DC Orders ED Discharge Orders     None         [place] 04/09/23 2310    Laurence Spates, MD 04/11/23 [phone]

## 2023-04-09 NOTE — Discharge Instructions (Signed)
Continue taking the entire course of antibiotics that we have prescribed, and return for further evaluation if you are having no significant improvement after 2 to 3 days of actively taking I recommend returning for further evaluation

## 2023-06-13 ENCOUNTER — Ambulatory Visit: Payer: Medicaid Other

## 2023-06-20 ENCOUNTER — Ambulatory Visit: Payer: Medicaid Other | Admitting: *Deleted

## 2023-06-20 ENCOUNTER — Other Ambulatory Visit: Payer: Self-pay

## 2023-06-20 VITALS — BP 130/68 | HR 102 | Ht 62.5 in | Wt 152.6 lb

## 2023-06-20 DIAGNOSIS — Z3042 Encounter for surveillance of injectable contraceptive: Secondary | ICD-10-CM

## 2023-06-20 MED ORDER — MEDROXYPROGESTERONE ACETATE 150 MG/ML IM SUSY
150.0000 mg | PREFILLED_SYRINGE | Freq: Once | INTRAMUSCULAR | Status: AC
  Administered 2023-06-20: 150 mg via INTRAMUSCULAR

## 2023-06-20 NOTE — Progress Notes (Signed)
Here for depo-provera. Last given 03/21/23. Last annual 09/05/22/. Last pap 03/15/21. Injection given without complaint. Sent to desk to schedule next injection and annual exam. Nancy Fetter

## 2023-07-26 IMAGING — US US OB TRANSVAGINAL
1 series · 14 of 28 positions shown · non-contrast
Comparison: None.

CLINICAL DATA: Rule out ectopic

EXAM:
OBSTETRIC <14 WK ULTRASOUND
TECHNIQUE: Transabdominal ultrasound was performed for evaluation of the
gestation as well as the maternal uterus and adnexal regions.

[Series 1: us ob transvaginal · 43 acquisitions, 14 frames shown]
[im 2/43]
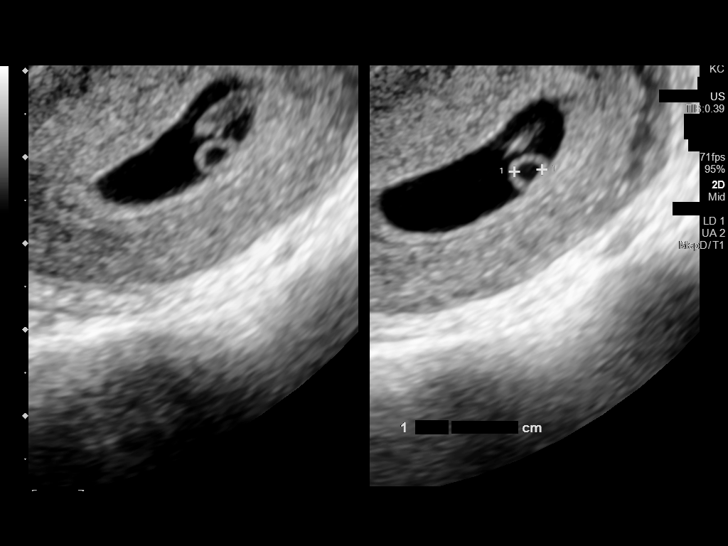
[im 5/43]
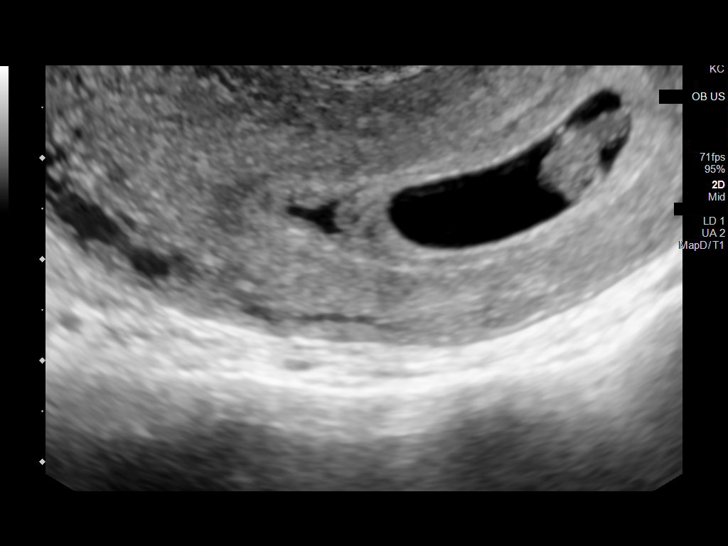
[im 8/43]
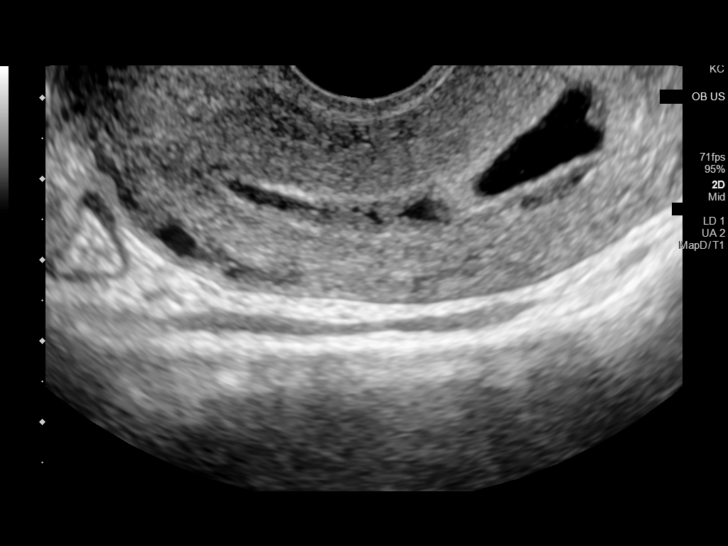
[im 11/43]
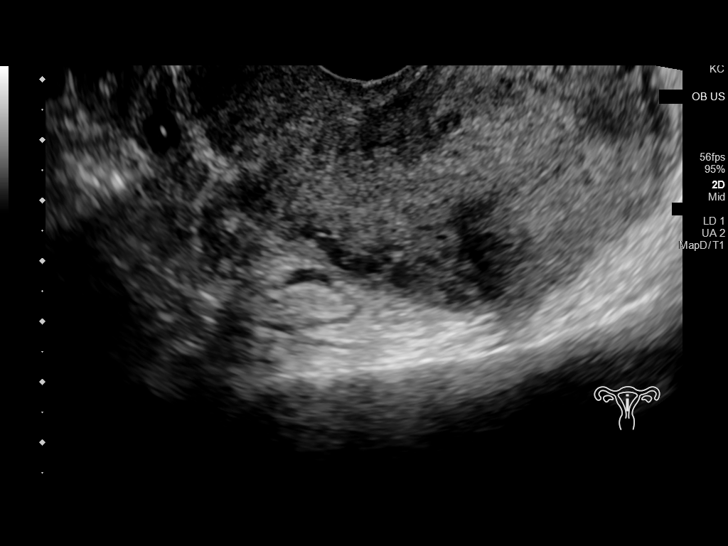
[im 15/43]
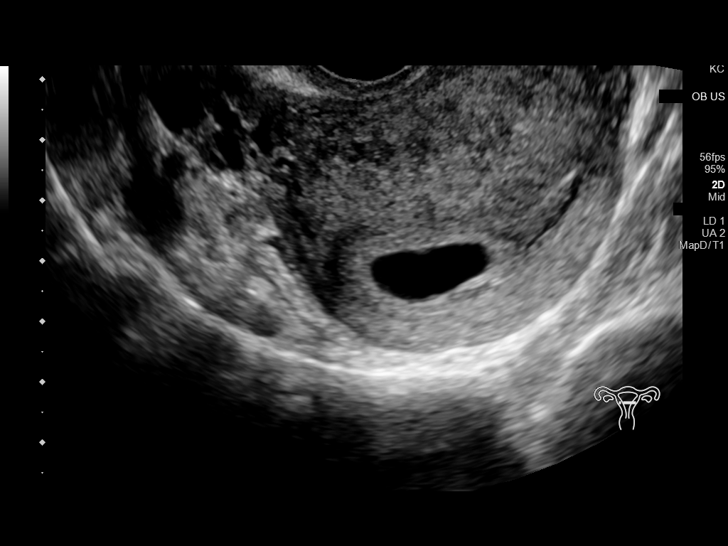
[im 18/43]
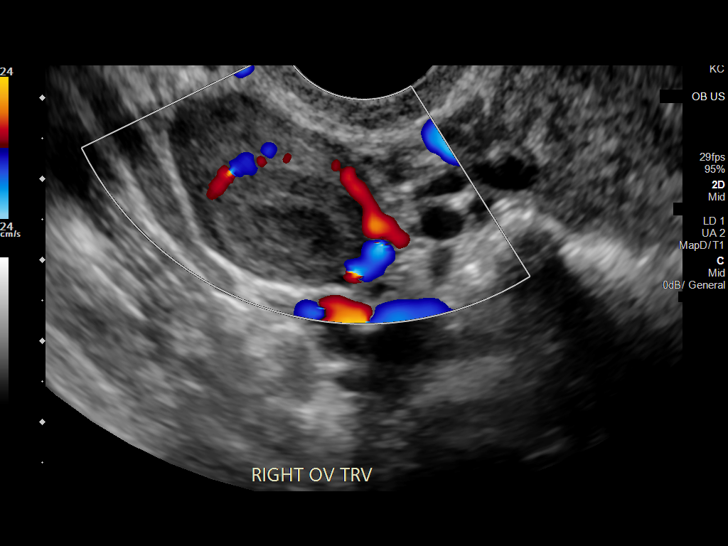
[im 21/43]
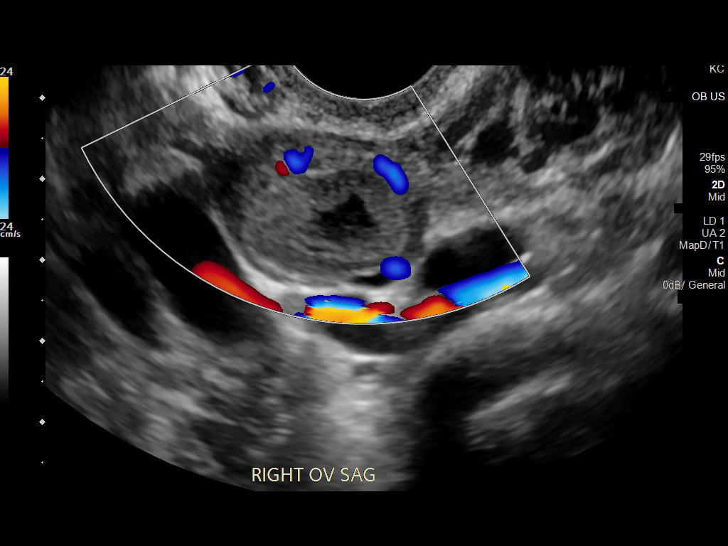
[im 24/43]
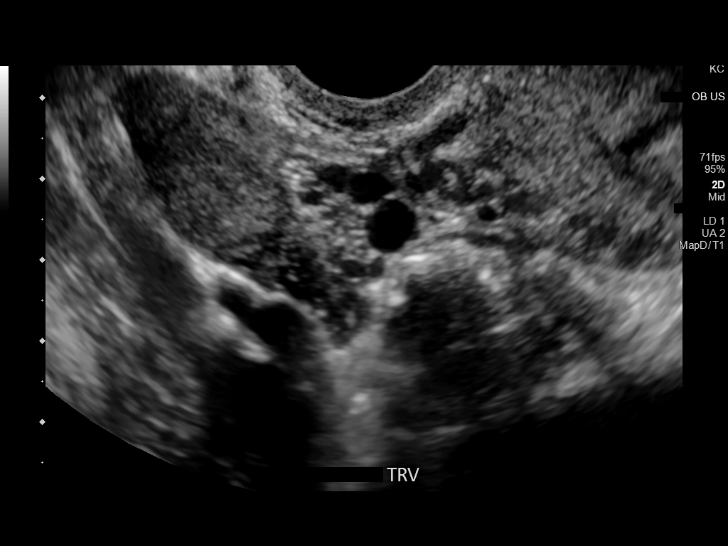
[im 27/43]
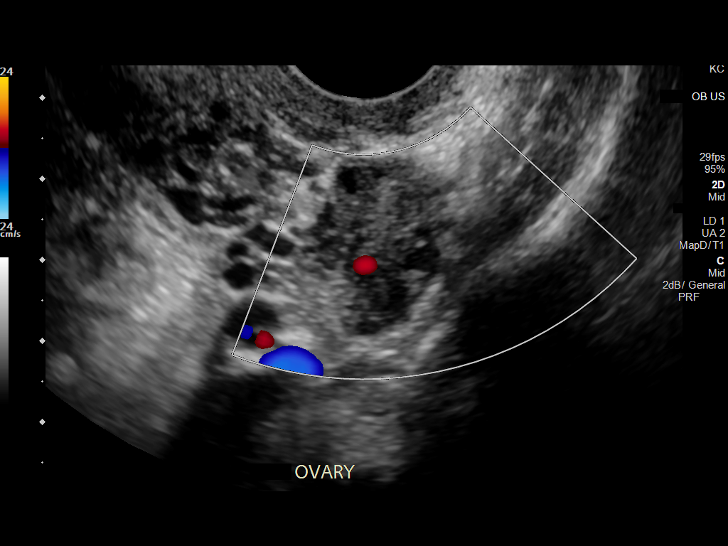
[im 30/43]
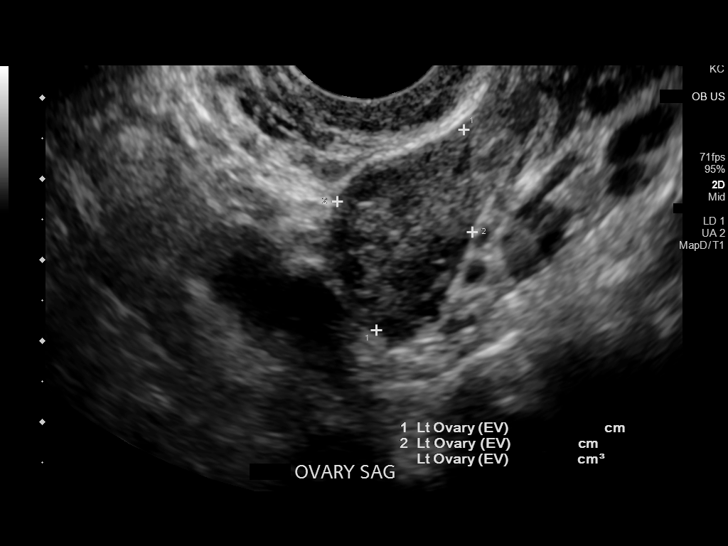
[im 33/43]
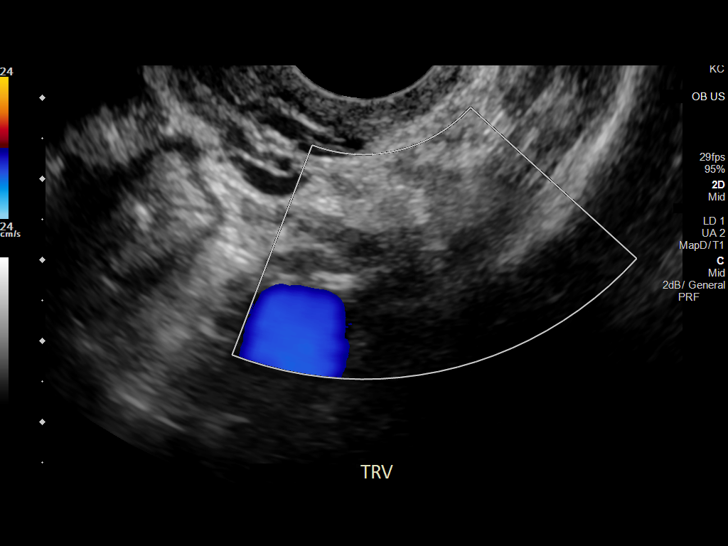
[im 36/43]
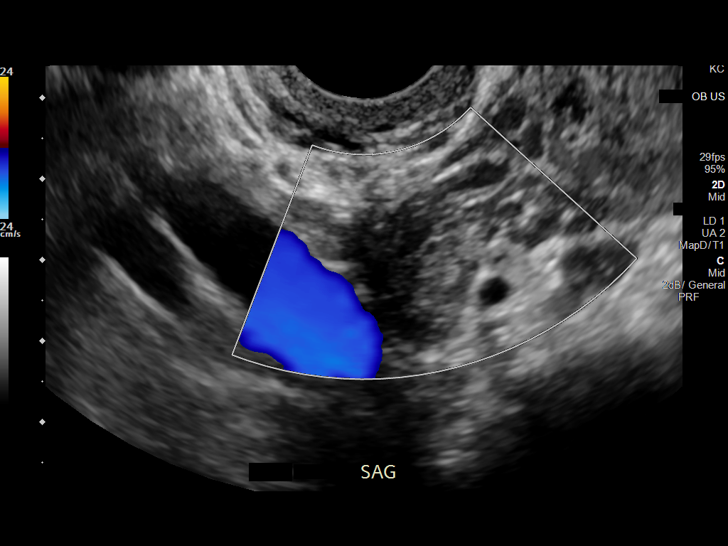
[im 39/43]
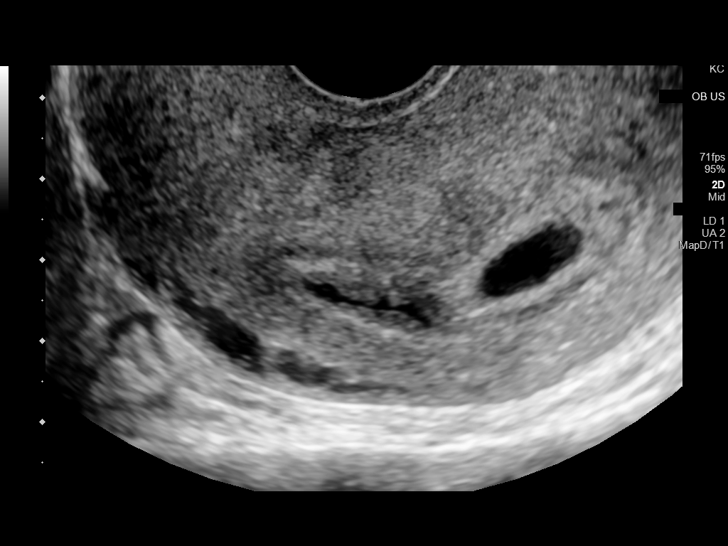
[im 43/43]
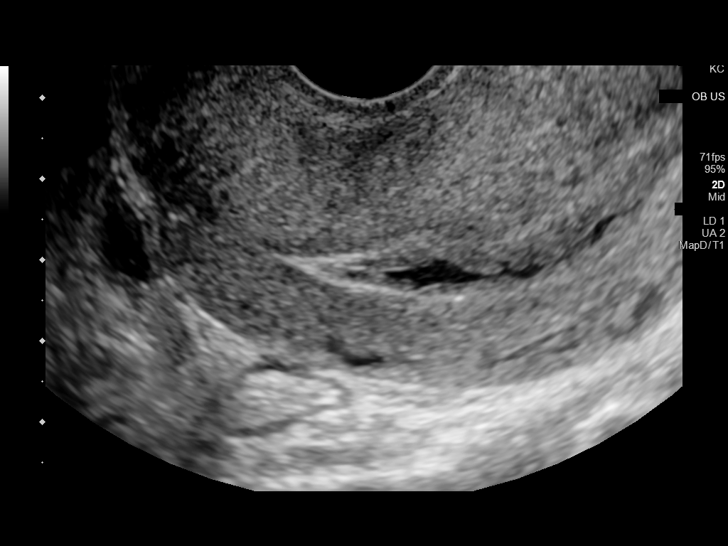

[14 of 28 positions shown; findings below may reference images not displayed]

FINDINGS: Intrauterine gestational sac: Single

Yolk sac:  Not Visualized.

Embryo:  Visualized.

Cardiac Activity: Visualized.

Heart Rate: 275 bpm

MSD:  17.6 mm   6 w   5 d

CRL:   10.0 mm   7 w 1 d

Subchorionic hemorrhage:  None visualized.

Maternal uterus/adnexae: The ovaries are normal with a probable
corpus luteum on the right. There is a small amount of fluid in the
endometrial cavity.
IMPRESSION: 1. Single live intrauterine pregnancy identified as above.
2. Small amount of fluid in the endometrial cavity. Attention on
follow up obstetric ultrasounds.

## 2023-09-05 ENCOUNTER — Ambulatory Visit: Payer: Medicaid Other

## 2023-09-16 ENCOUNTER — Ambulatory Visit

## 2023-09-19 ENCOUNTER — Ambulatory Visit: Admitting: General Practice

## 2023-09-19 VITALS — BP 129/79 | HR 88 | Ht 61.0 in | Wt 159.0 lb

## 2023-09-19 DIAGNOSIS — Z3042 Encounter for surveillance of injectable contraceptive: Secondary | ICD-10-CM | POA: Diagnosis not present

## 2023-09-19 MED ORDER — MEDROXYPROGESTERONE ACETATE 150 MG/ML IM SUSY
150.0000 mg | PREFILLED_SYRINGE | Freq: Once | INTRAMUSCULAR | Status: AC
  Administered 2023-09-19: 150 mg via INTRAMUSCULAR

## 2023-09-19 NOTE — Progress Notes (Signed)
 Sherri Flores here for Depo-Provera Injection. Injection administered without complication. Patient will return in 3 months for next injection between 7/3 and 7/17. Next annual visit due next week- currently scheduled for next month.   Doria Garden, RN 09/19/2023  8:59 AM

## 2023-10-10 ENCOUNTER — Ambulatory Visit: Admitting: Obstetrics and Gynecology

## 2023-10-10 NOTE — Progress Notes (Unsigned)
 ANNUAL EXAM Patient name: Sherri Flores MRN [id number]  Date of birth: [birth year] Chief Complaint:   No chief complaint on file.  History of Present Illness:   Sherri Flores is a 33 y.o. [phone] being seen today for a routine annual exam.  Current complaints: ***  Menstrual concerns? {yes/no:20286}  *** Breast or nipple changes? {yes/no:20286} *** Contraception use? {yes/no:20286} *** Sexually active? {yes/no:20286} ***  No LMP recorded. Patient has had an injection.   The pregnancy intention screening data noted above was reviewed. Potential methods of contraception were discussed. The patient elected to proceed with No data recorded.   Last pap     Component Value Date/Time   DIAGPAP  03/15/2021 1052    - Negative for intraepithelial lesion or malignancy (NILM)   ADEQPAP  03/15/2021 1052    Satisfactory for evaluation; transformation zone component PRESENT.     H/O abnormal pap: {yes/yes***/no:23866} Last mammogram: ***.  Last colonoscopy: ***.      09/25/2022    8:54 AM 07/19/2021    3:34 PM 07/05/2021   10:44 AM 06/06/2021    4:50 PM 04/10/2021   11:29 AM  Depression screen PHQ 2/9  Decreased Interest 0 0 0 0 0  Down, Depressed, Hopeless 0 0 0 0 0  PHQ - 2 Score 0 0 0 0 0  Altered sleeping 0 0 0 0 1  Tired, decreased energy 0 0 0 0 1  Change in appetite 0 0 0 0 0  Feeling bad or failure about yourself  0 0 0 0 0  Trouble concentrating 0 0 0 0 0  Moving slowly or fidgety/restless 0 0 0 0 0  Suicidal thoughts 0 0 0 0 0  PHQ-9 Score 0 0 0 0 2  Difficult doing work/chores  Not difficult at all Not difficult at all          09/25/2022    8:55 AM 07/19/2021    3:34 PM 07/05/2021   10:45 AM 06/06/2021    4:50 PM  GAD 7 : Generalized Anxiety Score  Nervous, Anxious, on Edge 0 0 0 1  Control/stop worrying 0 0 0 1  Worry too much - different things 0 0 0 1  Trouble relaxing 0 0 0 0  Restless 0 0 0 0  Easily annoyed or irritable 0 0 0 0  Afraid - awful might  happen 0 0 0 0  Total GAD 7 Score 0 0 0 3  Anxiety Difficulty  Not difficult at all Not difficult at all      Review of Systems:   Pertinent items are noted in HPI Denies any headaches, blurred vision, fatigue, shortness of breath, chest pain, abdominal pain, abnormal vaginal discharge/itching/odor/irritation, problems with periods, bowel movements, urination, or intercourse unless otherwise stated above. Pertinent History Reviewed:  Reviewed past medical,surgical, social and family history.  Reviewed problem list, medications and allergies. Physical Assessment:  There were no vitals filed for this visit.There is no height or weight on file to calculate BMI.        Physical Examination:   General appearance - well appearing, and in no distress  Mental status - alert, oriented to person, place, and time  Psych:  She has a normal mood and affect  Skin - warm and dry, normal color, no suspicious lesions noted  Chest - effort normal, all lung fields clear to auscultation bilaterally  Heart - normal rate and regular rhythm  Breasts - breasts appear normal, no suspicious  masses, no skin or nipple changes or  axillary nodes  Abdomen - soft, nontender, nondistended, no masses or organomegaly  Pelvic -  VULVA: normal appearing vulva with no masses, tenderness or lesions   VAGINA: normal appearing vagina with normal color and discharge, no lesions   CERVIX: normal appearing cervix without discharge or lesions, no CMT  Thin prep pap is {Desc; done/not:10129} *** HR HPV cotesting  UTERUS: uterus is felt to be normal size, shape, consistency and nontender   ADNEXA: No adnexal masses or tenderness noted.  Extremities:  No swelling or varicosities noted  Chaperone present for exam  No results found for this or any previous visit (from the past 24 hours).    Assessment & Plan:  There are no diagnoses linked to this encounter.      No orders of the defined types were placed in this  encounter.   Meds: No orders of the defined types were placed in this encounter.   Follow-up: No follow-ups on file.  Kiki Pelton, MD 10/10/2023 8:42 AM

## 2023-12-05 ENCOUNTER — Other Ambulatory Visit: Payer: Self-pay

## 2023-12-05 ENCOUNTER — Ambulatory Visit: Admitting: General Practice

## 2023-12-05 ENCOUNTER — Encounter: Payer: Self-pay | Admitting: General Practice

## 2023-12-05 VITALS — BP 136/76 | HR 90 | Ht 61.0 in | Wt 160.0 lb

## 2023-12-05 DIAGNOSIS — Z3042 Encounter for surveillance of injectable contraceptive: Secondary | ICD-10-CM | POA: Diagnosis not present

## 2023-12-05 MED ORDER — MEDROXYPROGESTERONE ACETATE 150 MG/ML IM SUSY
150.0000 mg | PREFILLED_SYRINGE | Freq: Once | INTRAMUSCULAR | Status: AC
  Administered 2023-12-05: 150 mg via INTRAMUSCULAR

## 2023-12-05 NOTE — Progress Notes (Signed)
 Amberle MALVA Daring here for Depo-Provera  Injection. Injection administered without complication. Patient will return in 3 months for next injection between 9/18 and 10/2. Next annual visit due with next depo.   Elenor Mole, RN 12/05/2023  8:51 AM

## 2024-02-20 ENCOUNTER — Ambulatory Visit

## 2024-02-25 ENCOUNTER — Ambulatory Visit: Admitting: *Deleted

## 2024-02-25 VITALS — BP 133/83 | HR 87 | Wt 161.0 lb

## 2024-02-25 DIAGNOSIS — Z3042 Encounter for surveillance of injectable contraceptive: Secondary | ICD-10-CM

## 2024-02-25 MED ORDER — MEDROXYPROGESTERONE ACETATE 150 MG/ML IM SUSP
150.0000 mg | Freq: Once | INTRAMUSCULAR | Status: AC
  Administered 2024-02-25: 150 mg via INTRAMUSCULAR

## 2024-02-25 NOTE — Progress Notes (Signed)
 Sherri Flores here for Depo-Provera  Injection. Injection administered without complication. Patient will return in 3 months for next injection between 05/12/24 and 05/26/24. Next annual visit is due.   Rosina, RN

## 2024-04-08 ENCOUNTER — Telehealth: Admitting: Family Medicine

## 2024-04-08 ENCOUNTER — Telehealth: Admitting: Physician Assistant

## 2024-04-08 DIAGNOSIS — L089 Local infection of the skin and subcutaneous tissue, unspecified: Secondary | ICD-10-CM

## 2024-04-08 DIAGNOSIS — W57XXXA Bitten or stung by nonvenomous insect and other nonvenomous arthropods, initial encounter: Secondary | ICD-10-CM

## 2024-04-08 DIAGNOSIS — L0390 Cellulitis, unspecified: Secondary | ICD-10-CM

## 2024-04-08 MED ORDER — CEPHALEXIN 500 MG PO CAPS: 500.0000 mg | ORAL_CAPSULE | Freq: Four times a day (QID) | ORAL | 0 refills | Status: DC

## 2024-04-08 MED ORDER — MUPIROCIN 2 % EX OINT: 1.0000 | TOPICAL_OINTMENT | Freq: Two times a day (BID) | CUTANEOUS | 0 refills | Status: AC

## 2024-04-08 NOTE — Progress Notes (Signed)
 Patient seen earlier today for bug bite on the left lateral lower leg. Was prescribed Mupirocin ointment.   Reports since that visit the head on the bite has grown in size. Reports the redness and swelling is unchanged.   Would like an oral antibiotic.   On Exam: Pustular lesion centrally measuring close to 1cm in diameter with surrounding erythema measuring about 3cm diameter total. No drainage.   Patient denies fevers, chills, nausea, vomiting.  Added Keflex 500mg  QID for infection. Continue Mupirocin ointment as well.   Seek in person evaluation if worsening.   No charge for second visit of the day.

## 2024-04-08 NOTE — Progress Notes (Signed)
 Virtual Visit Consent   Sherri Flores, you are scheduled for a virtual visit with a  provider today. Just as with appointments in the office, your consent must be obtained to participate. Your consent will be active for this visit and any virtual visit you may have with one of our providers in the next 365 days. If you have a MyChart account, a copy of this consent can be sent to you electronically.  As this is a virtual visit, video technology does not allow for your provider to perform a traditional examination. This may limit your provider's ability to fully assess your condition. If your provider identifies any concerns that need to be evaluated in person or the need to arrange testing (such as labs, EKG, etc.), we will make arrangements to do so. Although advances in technology are sophisticated, we cannot ensure that it will always work on either your end or our end. If the connection with a video visit is poor, the visit may have to be switched to a telephone visit. With either a video or telephone visit, we are not always able to ensure that we have a secure connection.  By engaging in this virtual visit, you consent to the provision of healthcare and authorize for your insurance to be billed (if applicable) for the services provided during this visit. Depending on your insurance coverage, you may receive a charge related to this service.  I need to obtain your verbal consent now. Are you willing to proceed with your visit today? Sherri Flores has provided verbal consent on 04/08/2024 for a virtual visit (video or telephone). Sherri CHRISTELLA Barefoot, NP  Date: 04/08/2024 1:48 PM   Virtual Visit via Video Note   I, Sherri Flores, connected with  Sherri Flores  ([id number], 02-09-1991) on 04/08/24 at  1:45 PM EST by a video-enabled telemedicine application and verified that I am speaking with the correct person using two identifiers.  Location: Patient: Virtual Visit Location Patient:  Home Provider: Virtual Visit Location Provider: Home Office   I discussed the limitations of evaluation and management by telemedicine and the availability of in person appointments. The patient expressed understanding and agreed to proceed.    History of Present Illness: Sherri Flores is a 33 y.o. who identifies as a female who was assigned female at birth, and is being seen today for bite from insect  Onset was last night -with itching and worsen this morning but now has a pus blister at the site.  Modifying factors are hydrocortisone cream and triple abx  History of staph cellulitis in 2024 Denies chest pain, shortness of breath, fevers, chills, other areas like this   Problems: There are no active problems to display for this patient.   Allergies: No Known Allergies Medications:  Current Outpatient Medications:    Multiple Vitamins-Minerals (ONE-A-DAY WOMENS PO), Take by mouth., Disp: , Rfl:   Observations/Objective: Patient is well-developed, well-nourished in no acute distress.  Resting comfortably  at home.  Head is normocephalic, atraumatic.  No labored breathing.  Speech is clear and coherent with logical content.  Patient is alert and oriented at baseline.  Small raised vesicle that is filled with purulent discharge, some redness around the site, no swelling   Assessment and Plan:  1. Skin infection (Primary)  - mupirocin ointment (BACTROBAN) 2 %; Apply 1 Application topically 2 (two) times daily.  Dispense: 22 g; Refill: 0  -possible infection from scratching a bug bite, advised on strict  in person precautions -keep covered at night -warm compresses to bring to a head, do not pop the area - allow it to burst on its own -use ointment sent -follow up if not improving- redness worsens, swelling, warmth, or it does not open and drain in a few days, fever develops  Reviewed side effects, risks and benefits of medication.    Patient acknowledged agreement and  understanding of the plan.   Past Medical, Surgical, Social History, Allergies, and Medications have been Reviewed.      Follow Up Instructions: I discussed the assessment and treatment plan with the patient. The patient was provided an opportunity to ask questions and all were answered. The patient agreed with the plan and demonstrated an understanding of the instructions.  A copy of instructions were sent to the patient via MyChart unless otherwise noted below.     The patient was advised to call back or seek an in-person evaluation if the symptoms worsen or if the condition fails to improve as anticipated.    Sherri CHRISTELLA Barefoot, NP

## 2024-04-08 NOTE — Patient Instructions (Signed)
 Lexany MALVA Flores, thank you for joining Delon CHRISTELLA Dickinson, PA-C for today's virtual visit.  While this provider is not your primary care provider (PCP), if your PCP is located in our provider database this encounter information will be shared with them immediately following your visit.   A [facility] MyChart account gives you access to today's visit and all your visits, tests, and labs performed at [facility]  click here if you don't have a [facility] MyChart account or go to [email or web address]  Consent: (Patient) Sherri Flores provided verbal consent for this virtual visit at the beginning of the encounter.  Current Medications:  Current Outpatient Medications:    cephALEXin (KEFLEX) 500 MG capsule, Take 1 capsule (500 mg total) by mouth 4 (four) times daily., Disp: 20 capsule, Rfl: 0   Multiple Vitamins-Minerals (ONE-A-DAY WOMENS PO), Take by mouth., Disp: , Rfl:    mupirocin ointment (BACTROBAN) 2 %, Apply 1 Application topically 2 (two) times daily., Disp: 22 g, Rfl: 0   Medications ordered in this encounter:  Meds ordered this encounter  Medications   cephALEXin (KEFLEX) 500 MG capsule    Sig: Take 1 capsule (500 mg total) by mouth 4 (four) times daily.    Dispense:  20 capsule    Refill:  0    Supervising Provider:   BLAISE ALEENE MALVA [[id number]]     *If you need refills on other medications prior to your next appointment, please contact your pharmacy*  Follow-Up: Call back or seek an in-person evaluation if the symptoms worsen or if the condition fails to improve as anticipated.  [facility] [phone]  Other Instructions Insect Bite, Adult An insect bite can make your skin red, itchy, and swollen. An insect bite is different from an insect sting, which happens when an insect injects poison (venom) into the skin. Some insects can spread disease to people through a bite. However, most insect bites do not lead to disease and are not  serious. What are the causes? Insects may bite for a variety of reasons, including: Hunger. To defend themselves. Insects that bite include: Spiders. Mosquitoes and flies. Ticks and fleas. Ants. Kissing bugs. Chiggers. What are the signs or symptoms? In many cases, symptoms last for 2-4 days. However, itching can last up to 10 days. Symptoms include: Itching or pain in the bite area. Redness and swelling in the bite area. An open wound (skin ulcer). In rare cases, a person may have a severe allergic reaction (anaphylactic reaction) to a bite. Symptoms of an anaphylactic reaction may include: Feeling warm in the face (flushed). This may include redness. Itchy, red, swollen areas of skin (hives). Swelling of the eyes, lips, face, mouth, tongue, or throat. Wheezing or difficulty breathing, speaking, or swallowing. Dizziness, light-headedness, or fainting. Abdominal symptoms like cramping, nausea, vomiting, or diarrhea. How is this diagnosed? This condition is usually diagnosed based on symptoms and a physical exam. During the exam, your health care provider will look at the bite and ask you what kind of insect bit you. How is this treated? Most insect bites are not serious. Symptoms often go away on their own and treatment is not usually needed. When treatment is recommended, it may include: Applying ice to the affected area. Applying steroid or other anti-itch creams, like calamine lotion, to the bite area. Medicines called antihistamines to reduce itching. You may also need: A tetanus shot if you are not up to date. Antibiotic cream or an oral antibiotic if  the bite becomes infected (this is uncommon). Follow these instructions at home: Bite area care  Do not scratch the bite area. It may help to cover the bite area with a bandage or close-fitting clothing. Keep the bite area clean and dry. Wash it every day with soap and water as told by your health care provider. Check the  bite area every day for signs of infection. Check for: More redness, swelling, or pain. Fluid or blood. Warmth. Pus or a bad smell. Managing pain, itching, and swelling  You may apply cortisone cream, calamine lotion, or a paste made of baking soda and water to the bite area as told by your health care provider. If directed, put ice on the bite area. To do this: Put ice in a plastic bag. Place a towel between your skin and the bag. Leave the ice on for 20 minutes, 2-3 times a day. If your skin turns bright red, remove the ice right away to prevent skin damage. The risk of skin damage is higher if you cannot feel pain, heat, or cold. General instructions Apply or take over-the-counter and prescription medicine only as told by your health care provider. If you were prescribed antibiotics, take or apply them as told by your health care provider. Do not stop using the antibiotic even if you start to feel better. How is this prevented? To help reduce your risk of insect bites: When you are outdoors, wear clothing that covers your arms and legs. This is especially important in the early morning and evening. Use insect repellent. The best insect repellents contain DEET, picaridin, oil of lemon eucalyptus (OLE), or IR3535. Consider spraying your clothing with a pesticide called permethrin. Permethrin helps prevent insect bites. It works for several weeks and for up to 5-6 clothing washes. Do not apply permethrin directly to the skin. If your home windows do not have screens, consider installing them. If you will be sleeping in an area where there are mosquitoes, consider covering your sleeping area with a mosquito net. Contact a health care provider if: Your bite area has signs of infection, such as: More redness, swelling, or pain. Fluid or blood. Warmth. Pus or a bad smell. You have a fever. Get help right away if: You have a rash. You have muscle or joint pain. You feel unusually tired or  weak. You have neck pain or a headache. You develop symptoms of an anaphylactic reaction. These may include: Swelling of the eyes, lips, face, mouth, tongue, or throat. Flushed skin or hives. Wheezing. Difficulty breathing, speaking, or swallowing. Dizziness, light-headedness, or fainting. Abdominal pain, cramping, vomiting, or diarrhea. These symptoms may be an emergency. Get help right away. Call 911. Do not wait to see if the symptoms will go away. Do not drive yourself to the hospital. Summary An insect bite can make your skin red, itchy, and swollen. Treatment is usually not needed. Symptoms often go away on their own. When treatment is recommended, it may involve taking medicine, applying medicine to the area, or applying ice. Apply or take over-the-counter and prescription medicines only as told by your health care provider. Use insect repellent to help prevent insect bites. Contact a health care provider if your bite area has signs of infection. This information is not intended to replace advice given to you by your health care provider. Make sure you discuss any questions you have with your health care provider. Document Revised: 08/30/2021 Document Reviewed: 08/15/2021 Elsevier Patient Education  2024 [employer].  If you have been instructed to have an in-person evaluation today at a local Urgent Care facility, please use the link below. It will take you to a list of all of our available [facility], including address, phone number and hours of operation. Please do not delay care.  [facility]  If you or a family member do not have a primary care provider, use the link below to schedule a visit and establish care. When you choose a [facility] primary care physician or advanced practice provider, you gain a long-term partner in health. Find a Primary Care Provider  Learn more about [facility]'s in-office and virtual care options: [facility] -  Get Care Now

## 2024-04-08 NOTE — Patient Instructions (Addendum)
  [place] MALVA Daring, thank you for joining Chiquita CHRISTELLA Barefoot, NP for today's virtual visit.  While this provider is not your primary care provider (PCP), if your PCP is located in our provider database this encounter information will be shared with them immediately following your visit.   A [facility] MyChart account gives you access to today's visit and all your visits, tests, and labs performed at [facility]  click here if you don't have a [facility] MyChart account or go to [email or web address]  Consent: (Patient) Oluwaseyi MALVA Daring provided verbal consent for this virtual visit at the beginning of the encounter.  Current Medications:  Current Outpatient Medications:    mupirocin ointment (BACTROBAN) 2 %, Apply 1 Application topically 2 (two) times daily., Disp: 22 g, Rfl: 0   Multiple Vitamins-Minerals (ONE-A-DAY WOMENS PO), Take by mouth., Disp: , Rfl:    Medications ordered in this encounter:  Meds ordered this encounter  Medications   mupirocin ointment (BACTROBAN) 2 %    Sig: Apply 1 Application topically 2 (two) times daily.    Dispense:  22 g    Refill:  0    Supervising Provider:   BLAISE ALEENE MALVA [[id number]]     *If you need refills on other medications prior to your next appointment, please contact your pharmacy*  Follow-Up: Call back or seek an in-person evaluation if the symptoms worsen or if the condition fails to improve as anticipated.  [facility] [phone]  Other Instructions  -keep covered at night -warm compresses to bring to a head, do not pop the area - allow it to burst on its own -use ointment sent  -follow up if not improving- redness worsens, swelling, warmth, or it does not open and drain in a few days, fever develops  If you have been instructed to have an in-person evaluation today at a local Urgent Care facility, please use the link below. It will take you to a list of all of our available [facility],  including address, phone number and hours of operation. Please do not delay care.  [facility]  If you or a family member do not have a primary care provider, use the link below to schedule a visit and establish care. When you choose a [facility] primary care physician or advanced practice provider, you gain a long-term partner in health. Find a Primary Care Provider  Learn more about [facility]'s in-office and virtual care options: [facility] - Get Care Now

## 2024-04-27 ENCOUNTER — Ambulatory Visit: Admitting: Obstetrics & Gynecology

## 2024-05-14 ENCOUNTER — Ambulatory Visit

## 2024-05-26 ENCOUNTER — Other Ambulatory Visit: Payer: Self-pay

## 2024-05-26 ENCOUNTER — Ambulatory Visit

## 2024-05-26 VITALS — BP 124/82 | HR 82 | Ht 61.0 in | Wt 160.0 lb

## 2024-05-26 DIAGNOSIS — Z3042 Encounter for surveillance of injectable contraceptive: Secondary | ICD-10-CM | POA: Diagnosis not present

## 2024-05-26 MED ORDER — MEDROXYPROGESTERONE ACETATE 150 MG/ML IM SUSY
150.0000 mg | PREFILLED_SYRINGE | Freq: Once | INTRAMUSCULAR | Status: AC
  Administered 2024-05-26: 150 mg via INTRAMUSCULAR

## 2024-05-26 NOTE — Progress Notes (Signed)
 Sherri Flores Daring here for Depo-Provera  Injection. Injection administered without complication. Last dose on 02/25/24. Patient will return in 3 months for next injection between March 10th and March 24th. Annual scheduled for 06/17/24 at 0935. Patient confirmed scheduled appointment.  Rosaline Pendleton, RN 05/26/2024  2:01 PM

## 2024-06-17 ENCOUNTER — Encounter: Payer: Self-pay | Admitting: Obstetrics & Gynecology

## 2024-06-17 ENCOUNTER — Other Ambulatory Visit (HOSPITAL_COMMUNITY)
Admission: RE | Admit: 2024-06-17 | Discharge: 2024-06-17 | Disposition: A | Source: Ambulatory Visit | Attending: Obstetrics & Gynecology | Admitting: Obstetrics & Gynecology

## 2024-06-17 ENCOUNTER — Ambulatory Visit: Admitting: Obstetrics & Gynecology

## 2024-06-17 VITALS — Wt 160.4 lb

## 2024-06-17 DIAGNOSIS — Z01419 Encounter for gynecological examination (general) (routine) without abnormal findings: Secondary | ICD-10-CM | POA: Diagnosis present

## 2024-06-17 DIAGNOSIS — Z113 Encounter for screening for infections with a predominantly sexual mode of transmission: Secondary | ICD-10-CM | POA: Insufficient documentation

## 2024-06-17 NOTE — Progress Notes (Signed)
 "   GYNECOLOGY ANNUAL PREVENTATIVE CARE ENCOUNTER NOTE  History:    Sherri Flores is a 34 y.o. [phone] female here for a routine annual gynecologic exam.  Current complaints: None. She is currently using Deepo-Provera  for contraception. She had questions about long term safety regarding meningiomas and bone loss. She is a current smoker, but is interested in quitting. She will try quitting on her own. Denies abnormal vaginal bleeding, discharge, pelvic pain, problems with intercourse or other gynecologic concerns.  Gynecologic History No LMP recorded. Patient has had an injection. Contraception: Depo-Provera  injections Last Pap: 03/15/2021. Result was normal with negative HPV Last Mammogram: n/a.   Last Colonoscopy: n/a.   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 She has been smoking an average of 0.1 packs per day. She has never used smokeless tobacco. She reports current alcohol use. She reports that she does not use drugs.  Family History  Problem Relation Age of Onset   Hypertension Maternal Grandmother     The following  portions of the patient's history were reviewed and updated as appropriate: allergies, current medications, past family history, past medical history, past social history, past surgical history and problem list.  Review of Systems Pertinent items noted in HPI and remainder of comprehensive ROS otherwise negative.  Physical Exam:  Wt 160 lb 6.4 oz (72.8 kg)   BMI 30.31 kg/m  CONSTITUTIONAL: Well-developed, well-nourished female in no acute distress.  HENT:  Normocephalic, atraumatic, External right and left ear normal.  EYES: Conjunctivae and EOM are normal. Pupils are equal, round, and reactive to light. No scleral icterus.  NECK: Normal range of motion, supple, no masses observed. SKIN: Skin is warm and dry. No rash noted. Not diaphoretic. No erythema. No pallor. MUSCULOSKELETAL: Normal range of motion. No tenderness.  No cyanosis, clubbing, or edema. NEUROLOGIC: Alert and oriented to person, place, and time. Normal muscle tone coordination.  PSYCHIATRIC: Normal mood and affect. Normal behavior. Normal judgment and thought content. CARDIOVASCULAR: Normal heart rate noted, regular rhythm RESPIRATORY: Clear to auscultation bilaterally. Effort and breath sounds normal, no problems with respiration noted. BREASTS: Symmetric in size. No masses, tenderness, skin changes, nipple drainage, or lymphadenopathy bilaterally. Performed in the presence of a chaperone. ABDOMEN: Soft, no distention noted.  No tenderness, rebound or guarding.  PELVIC: Normal appearing external genitalia and urethral meatus; normal appearing vaginal  mucosa and cervix.  No abnormal vaginal discharge noted.  Pap smear obtained.  Normal uterine size, no other palpable masses, no uterine or adnexal tenderness.  Performed in the presence of a chaperone.  Assessment and Plan:    1. Routine screening for STI (sexually transmitted infection) - Cytology - PAP( [facility]) - RPR+HBsAg+HCVAb+HIV  2. Well woman exam with routine  gynecological exam (Primary) - Cytology - PAP( [place])  3. Current Smoker - Offered referral to [facility]. Can be referred anytime if help with quitting is needed.   Will follow up results of pap smear and manage accordingly. Normal breast examination today, she was advised to perform periodic self breast examinations.  Reassured about the safety of Depo Provera , she will continue this contraceptive modality. Routine preventative health maintenance measures emphasized. Please refer to After Visit Summary for other counseling recommendations.      Karl Punch, MS3 06/17/2024 10:34 AM   Attestation of Attending Supervision of Student:  I confirm that I have verified the information documented in the medical students note and that I have also personally reperformed the history, physical exam and all medical decision making activities.  I have verified that all services and findings are accurately documented in this student's note; and I agree with management and plan as outlined in the documentation. I have also made any necessary editorial changes.   GLORIS HUGGER, MD, FACOG Attending Obstetrician & Gynecologist, [facility] for [facility], [facility]      [1]  Current Outpatient Medications on File Prior to Visit  Medication Sig Dispense Refill   Multiple Vitamins-Minerals (ONE-A-DAY WOMENS PO) Take by mouth.     mupirocin  ointment (BACTROBAN ) 2 % Apply 1 Application topically 2 (two) times daily. (Patient not taking: Reported on 05/26/2024) 22 g 0   No current facility-administered medications on file prior to visit.  [2] No Known Allergies  "

## 2024-06-18 LAB — RPR, QUANT+TP ABS (REFLEX)
Rapid Plasma Reagin, Quant: 1:8 {titer} — ABNORMAL HIGH
T Pallidum Abs: REACTIVE — AB

## 2024-06-18 LAB — RPR+HBSAG+HCVAB+...
HIV Screen 4th Generation wRfx: NONREACTIVE
Hep C Virus Ab: NONREACTIVE
Hepatitis B Surface Ag: NEGATIVE
RPR Ser Ql: REACTIVE — AB

## 2024-06-19 LAB — CYTOLOGY - PAP
Chlamydia: NEGATIVE
Comment: NEGATIVE
Comment: NEGATIVE
Comment: NEGATIVE
Comment: NORMAL
Diagnosis: NEGATIVE
Diagnosis: REACTIVE
High risk HPV: NEGATIVE
Neisseria Gonorrhea: NEGATIVE
Trichomonas: NEGATIVE

## 2024-06-24 ENCOUNTER — Ambulatory Visit: Payer: Self-pay | Admitting: Obstetrics & Gynecology
